# Patient Record
Sex: Male | Born: 1965
Health system: Southern US, Community
[De-identification: ages and names within clinical notes are randomized; demographics above are authoritative.]

## PROBLEM LIST (undated history)

## (undated) DIAGNOSIS — S3992XA Unspecified injury of lower back, initial encounter: Secondary | ICD-10-CM

## (undated) HISTORY — PX: COLONOSCOPY: SHX174

## (undated) HISTORY — DX: Unspecified injury of lower back, initial encounter: S39.92XA

---

## 2000-09-21 ENCOUNTER — Emergency Department (HOSPITAL_COMMUNITY): Admission: EM | Admit: 2000-09-21 | Discharge: 2000-09-21 | Payer: Self-pay | Admitting: Emergency Medicine

## 2005-07-20 ENCOUNTER — Ambulatory Visit: Payer: Self-pay | Admitting: Internal Medicine

## 2005-12-27 ENCOUNTER — Ambulatory Visit: Payer: Self-pay | Admitting: Internal Medicine

## 2007-05-29 ENCOUNTER — Ambulatory Visit: Payer: Self-pay | Admitting: Internal Medicine

## 2009-02-10 ENCOUNTER — Ambulatory Visit: Payer: Self-pay | Admitting: Internal Medicine

## 2009-02-17 LAB — CONVERTED CEMR LAB
ALT: 22 units/L (ref 0–53)
AST: 24 units/L (ref 0–37)
BUN: 16 mg/dL (ref 6–23)
Basophils Absolute: 0 10*3/uL (ref 0.0–0.1)
Basophils Relative: 0.1 % (ref 0.0–3.0)
CO2: 27 meq/L (ref 19–32)
Calcium: 8.9 mg/dL (ref 8.4–10.5)
Chloride: 109 meq/L (ref 96–112)
Cholesterol: 182 mg/dL (ref 0–200)
Creatinine, Ser: 1.1 mg/dL (ref 0.4–1.5)
Eosinophils Absolute: 0.1 10*3/uL (ref 0.0–0.7)
Eosinophils Relative: 1 % (ref 0.0–5.0)
GFR calc non Af Amer: 94.44 mL/min (ref >60)
Glucose, Bld: 89 mg/dL (ref 70–99)
HCT: 46.7 % (ref 39.0–52.0)
HDL: 32.5 mg/dL — ABNORMAL LOW (ref >39.00)
Hemoglobin: 15.9 g/dL (ref 13.0–17.0)
LDL Cholesterol: 131 mg/dL — ABNORMAL HIGH (ref 0–99)
Lymphocytes Relative: 41.1 % (ref 12.0–46.0)
Lymphs Abs: 2.8 10*3/uL (ref 0.7–4.0)
MCHC: 34.1 g/dL (ref 30.0–36.0)
MCV: 88.2 fL (ref 78.0–100.0)
Monocytes Absolute: 0.6 10*3/uL (ref 0.1–1.0)
Monocytes Relative: 8.5 % (ref 3.0–12.0)
Neutro Abs: 3.2 10*3/uL (ref 1.4–7.7)
Neutrophils Relative %: 49.3 % (ref 43.0–77.0)
Platelets: 219 10*3/uL (ref 150.0–400.0)
Potassium: 3.9 meq/L (ref 3.5–5.1)
RBC: 5.29 M/uL (ref 4.22–5.81)
RDW: 13.5 % (ref 11.5–14.6)
Sodium: 141 meq/L (ref 135–145)
TSH: 1.5 microintl units/mL (ref 0.35–5.50)
Total CHOL/HDL Ratio: 6
Triglycerides: 93 mg/dL (ref 0.0–149.0)
VLDL: 18.6 mg/dL (ref 0.0–40.0)
WBC: 6.7 10*3/uL (ref 4.5–10.5)

## 2010-04-28 ENCOUNTER — Ambulatory Visit: Payer: Self-pay | Admitting: Internal Medicine

## 2010-05-04 LAB — CONVERTED CEMR LAB
ALT: 22 units/L (ref 0–53)
AST: 20 units/L (ref 0–37)
BUN: 13 mg/dL (ref 6–23)
Basophils Absolute: 0 10*3/uL (ref 0.0–0.1)
Basophils Relative: 0.4 % (ref 0.0–3.0)
CO2: 28 meq/L (ref 19–32)
Calcium: 9.4 mg/dL (ref 8.4–10.5)
Chloride: 106 meq/L (ref 96–112)
Cholesterol: 182 mg/dL (ref 0–200)
Creatinine, Ser: 1.1 mg/dL (ref 0.4–1.5)
Eosinophils Absolute: 0.1 10*3/uL (ref 0.0–0.7)
Eosinophils Relative: 0.8 % (ref 0.0–5.0)
GFR calc non Af Amer: 90.1 mL/min (ref >60)
Glucose, Bld: 85 mg/dL (ref 70–99)
HCT: 46.1 % (ref 39.0–52.0)
HDL: 31.8 mg/dL — ABNORMAL LOW (ref >39.00)
Hemoglobin: 15.6 g/dL (ref 13.0–17.0)
LDL Cholesterol: 125 mg/dL — ABNORMAL HIGH (ref 0–99)
Lymphocytes Relative: 36.8 % (ref 12.0–46.0)
Lymphs Abs: 2.5 10*3/uL (ref 0.7–4.0)
MCHC: 33.8 g/dL (ref 30.0–36.0)
MCV: 87.6 fL (ref 78.0–100.0)
Monocytes Absolute: 0.5 10*3/uL (ref 0.1–1.0)
Monocytes Relative: 6.8 % (ref 3.0–12.0)
Neutro Abs: 3.8 10*3/uL (ref 1.4–7.7)
Neutrophils Relative %: 55.2 % (ref 43.0–77.0)
PSA: 0.99 ng/mL (ref 0.10–4.00)
Platelets: 259 10*3/uL (ref 150.0–400.0)
Potassium: 4.6 meq/L (ref 3.5–5.1)
RBC: 5.26 M/uL (ref 4.22–5.81)
RDW: 15.1 % — ABNORMAL HIGH (ref 11.5–14.6)
Sodium: 142 meq/L (ref 135–145)
TSH: 1.67 microintl units/mL (ref 0.35–5.50)
Total CHOL/HDL Ratio: 6
Triglycerides: 125 mg/dL (ref 0.0–149.0)
VLDL: 25 mg/dL (ref 0.0–40.0)
WBC: 6.8 10*3/uL (ref 4.5–10.5)

## 2010-10-15 LAB — CONVERTED CEMR LAB
ALT: 18 units/L (ref 0–53)
AST: 19 units/L (ref 0–37)
BUN: 9 mg/dL (ref 6–23)
Basophils Absolute: 0 10*3/uL (ref 0.0–0.1)
Basophils Relative: 0.1 % (ref 0.0–1.0)
CO2: 30 meq/L (ref 19–32)
Calcium: 9.5 mg/dL (ref 8.4–10.5)
Chloride: 106 meq/L (ref 96–112)
Cholesterol: 183 mg/dL (ref 0–200)
Creatinine, Ser: 1 mg/dL (ref 0.4–1.5)
Direct LDL: 113.8 mg/dL
Eosinophils Absolute: 0.1 10*3/uL (ref 0.0–0.6)
Eosinophils Relative: 1.4 % (ref 0.0–5.0)
GFR calc Af Amer: 106 mL/min
GFR calc non Af Amer: 88 mL/min
Glucose, Bld: 74 mg/dL (ref 70–99)
HCT: 45 % (ref 39.0–52.0)
HDL: 26.5 mg/dL — ABNORMAL LOW (ref >39.0)
Hemoglobin: 15.1 g/dL (ref 13.0–17.0)
Lymphocytes Relative: 35.3 % (ref 12.0–46.0)
MCHC: 33.6 g/dL (ref 30.0–36.0)
MCV: 86.9 fL (ref 78.0–100.0)
Monocytes Absolute: 0.5 10*3/uL (ref 0.2–0.7)
Monocytes Relative: 7.9 % (ref 3.0–11.0)
Neutro Abs: 3.4 10*3/uL (ref 1.4–7.7)
Neutrophils Relative %: 55.3 % (ref 43.0–77.0)
PSA: 0.64 ng/mL (ref 0.10–4.00)
Platelets: 235 10*3/uL (ref 150–400)
Potassium: 4.1 meq/L (ref 3.5–5.1)
RBC: 5.18 M/uL (ref 4.22–5.81)
RDW: 13.9 % (ref 11.5–14.6)
Sodium: 139 meq/L (ref 135–145)
TSH: 1.21 microintl units/mL (ref 0.35–5.50)
Total CHOL/HDL Ratio: 6.9
Triglycerides: 206 mg/dL (ref 0–149)
VLDL: 41 mg/dL — ABNORMAL HIGH (ref 0–40)
WBC: 6.2 10*3/uL (ref 4.5–10.5)

## 2010-10-17 NOTE — Assessment & Plan Note (Signed)
Summary: cpx//kn   Vital Signs:  Patient profile:   45 year old male Height:      74 inches Weight:      197 pounds BMI:     25.38 Pulse rate:   83 / minute Pulse rhythm:   regular BP sitting:   112 / 80  (left arm) Cuff size:   regular  Vitals Entered By: Army Fossa CMA (April 28, 2010 1:22 PM) CC: CPX: Fasting    History of Present Illness: CPX no concerns   Current Medications (verified): 1)  None  Allergies (verified): No Known Drug Allergies  Past History:  Past Medical History: Reviewed history from 02/10/2009 and no changes required. Unremarkable  Past Surgical History: Reviewed history from 02/10/2009 and no changes required. Denies surgical history  Family History: Reviewed history from 02/10/2009 and no changes required. 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 CV:  Denies chest pain or discomfort and swelling of feet. Resp:  Denies cough and wheezing. GI:  Denies bloody stools, diarrhea, nausea, and vomiting. GU:  Denies dysuria, hematuria, urinary frequency, and urinary hesitancy. Psych:  Denies anxiety and depression.  Physical Exam  General:  alert, well-developed, and well-nourished.   Neck:  no masses, no thyromegaly, and normal carotid upstroke.   Lungs:  normal respiratory effort, no intercostal retractions, no accessory muscle use, and normal breath sounds.   Heart:  normal rate, regular rhythm, and no murmur.   Abdomen:  soft, non-tender, no distention, no masses, no guarding, and no rigidity.   Rectal:  No external abnormalities noted. Normal sphincter tone. No rectal masses or  tenderness. Brown stools, Hemoccult negative Prostate:  Prostate gland firm and smooth, no enlargement, nodularity, tenderness, mass, asymmetry or induration. Extremities:  no lower extremity edema Psych:  Cognition and judgment appear intact. Alert and cooperative with normal attention span and concentration.  not anxious appearing and not depressed appearing.     Impression & Recommendations:  Problem # 1:  HEALTH SCREENING (ICD-V70.0) Td 2010 doing great encouraged more exercise printed material provided regards diet  Will start doing DRE, PSA and Hemoccults every 2 years since the patient is a minority (AA)  Orders: Venipuncture (64403) TLB-BMP (Basic Metabolic Panel-BMET) (80048-METABOL) TLB-CBC Platelet - w/Differential (85025-CBCD) TLB-ALT (SGPT) (84460-ALT) TLB-AST (SGOT) (84450-SGOT) TLB-Lipid Panel (80061-LIPID) TLB-TSH (Thyroid Stimulating Hormone) (84443-TSH) TLB-PSA (Prostate Specific Antigen) (84153-PSA) Specimen Handling (47425)  Patient Instructions: 1)  Please schedule a follow-up appointment in 1 year.

## 2011-09-10 ENCOUNTER — Encounter: Payer: Self-pay | Admitting: Internal Medicine

## 2011-09-13 ENCOUNTER — Ambulatory Visit (INDEPENDENT_AMBULATORY_CARE_PROVIDER_SITE_OTHER): Payer: BC Managed Care – PPO | Admitting: Internal Medicine

## 2011-09-13 ENCOUNTER — Encounter: Payer: Self-pay | Admitting: Internal Medicine

## 2011-09-13 DIAGNOSIS — Z0000 Encounter for general adult medical examination without abnormal findings: Secondary | ICD-10-CM

## 2011-09-13 HISTORY — DX: Encounter for general adult medical examination without abnormal findings: Z00.00

## 2011-09-13 LAB — COMPREHENSIVE METABOLIC PANEL
ALT: 22 U/L (ref 0–53)
AST: 25 U/L (ref 0–37)
Albumin: 4 g/dL (ref 3.5–5.2)
Alkaline Phosphatase: 65 U/L (ref 39–117)
BUN: 18 mg/dL (ref 6–23)
CO2: 28 mEq/L (ref 19–32)
Calcium: 9.1 mg/dL (ref 8.4–10.5)
Chloride: 108 mEq/L (ref 96–112)
Creatinine, Ser: 1.3 mg/dL (ref 0.4–1.5)
GFR: 79.76 mL/min (ref >60.00)
Glucose, Bld: 96 mg/dL (ref 70–99)
Potassium: 4.8 mEq/L (ref 3.5–5.1)
Sodium: 142 mEq/L (ref 135–145)
Total Bilirubin: 0.6 mg/dL (ref 0.3–1.2)
Total Protein: 6.8 g/dL (ref 6.0–8.3)

## 2011-09-13 LAB — CBC WITH DIFFERENTIAL/PLATELET
Basophils Absolute: 0 10*3/uL (ref 0.0–0.1)
Basophils Relative: 0.4 % (ref 0.0–3.0)
Eosinophils Absolute: 0.1 10*3/uL (ref 0.0–0.7)
Eosinophils Relative: 0.7 % (ref 0.0–5.0)
HCT: 44.3 % (ref 39.0–52.0)
Hemoglobin: 15 g/dL (ref 13.0–17.0)
Lymphocytes Relative: 32.5 % (ref 12.0–46.0)
Lymphs Abs: 2.6 10*3/uL (ref 0.7–4.0)
MCHC: 34 g/dL (ref 30.0–36.0)
MCV: 87.7 fl (ref 78.0–100.0)
Monocytes Absolute: 0.6 10*3/uL (ref 0.1–1.0)
Monocytes Relative: 7.5 % (ref 3.0–12.0)
Neutro Abs: 4.7 10*3/uL (ref 1.4–7.7)
Neutrophils Relative %: 58.9 % (ref 43.0–77.0)
Platelets: 234 10*3/uL (ref 150.0–400.0)
RBC: 5.05 Mil/uL (ref 4.22–5.81)
RDW: 14.8 % — ABNORMAL HIGH (ref 11.5–14.6)
WBC: 7.9 10*3/uL (ref 4.5–10.5)

## 2011-09-13 LAB — LIPID PANEL
Cholesterol: 196 mg/dL (ref 0–200)
HDL: 32.2 mg/dL — ABNORMAL LOW (ref >39.00)
Total CHOL/HDL Ratio: 6
Triglycerides: 220 mg/dL — ABNORMAL HIGH (ref 0.0–149.0)
VLDL: 44 mg/dL — ABNORMAL HIGH (ref 0.0–40.0)

## 2011-09-13 LAB — PSA: PSA: 0.85 ng/mL (ref 0.10–4.00)

## 2011-09-13 LAB — LDL CHOLESTEROL, DIRECT: Direct LDL: 121.2 mg/dL

## 2011-09-13 NOTE — Progress Notes (Signed)
  Subjective:    Patient ID: Jonathan Cohen, male    DOB: 29-Jun-1966, 45 y.o.   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  Respiratory: Negative for cough and shortness of breath.   Cardiovascular: Negative for chest pain and leg swelling.  Gastrointestinal: Negative for abdominal pain and blood in stool.  Genitourinary: Negative for dysuria and hematuria.       Objective:   Physical Exam  Constitutional: He is oriented to person, place, and time. He appears well-developed and well-nourished. No distress.  HENT:  Head: Normocephalic and atraumatic.  Neck: No thyromegaly present.       Nl carotid pulse   Cardiovascular: Normal rate, regular rhythm and normal heart sounds.   No murmur heard. Pulmonary/Chest: Effort normal and breath sounds normal. No respiratory distress. He has no wheezes. He has no rales.  Abdominal: Soft. Bowel sounds are normal. He exhibits no distension. There is no tenderness. There is no rebound and no guarding.  Genitourinary: Rectum normal and prostate normal. Guaiac negative stool.  Musculoskeletal: He exhibits no edema.  Neurological: He is alert and oriented to person, place, and time.  Skin: Skin is warm and dry. He is not diaphoretic.  Psychiatric: He has a normal mood and affect. His behavior is normal. Judgment and thought content normal.       Assessment & Plan:

## 2011-09-13 NOTE — Assessment & Plan Note (Addendum)
Td 2010 Declined flu shot doing great, encouraged more exercise and a healthier diet STE discussed  EKG nsr Labs (fasting x 8 hours) Will start doing DRE, PSA and Hemoccults every 2 years at age 45 since the patient is a minority (AA) Addendum--requested a "prostate check"--->DRE normal, PSA ordered

## 2011-09-14 ENCOUNTER — Encounter: Payer: Self-pay | Admitting: Internal Medicine

## 2011-09-14 LAB — TSH: TSH: 1.85 u[IU]/mL (ref 0.35–5.50)

## 2011-09-17 ENCOUNTER — Encounter: Payer: Self-pay | Admitting: Internal Medicine

## 2012-09-15 ENCOUNTER — Encounter: Payer: Self-pay | Admitting: Internal Medicine

## 2012-09-15 ENCOUNTER — Ambulatory Visit (INDEPENDENT_AMBULATORY_CARE_PROVIDER_SITE_OTHER): Payer: 59 | Admitting: Internal Medicine

## 2012-09-15 VITALS — BP 118/82 | HR 67 | Temp 97.6°F | Ht 75.0 in | Wt 199.0 lb

## 2012-09-15 DIAGNOSIS — Z0000 Encounter for general adult medical examination without abnormal findings: Secondary | ICD-10-CM

## 2012-09-15 LAB — LIPID PANEL: Total CHOL/HDL Ratio: 5

## 2012-09-15 NOTE — Patient Instructions (Signed)
Motrin 200 mg 2 tablets every 6 hours as needed for pain. Always take it with food. Watch for stomach side effects (gastritis): nausea, stomach pain, change in the color of stools. Call if the shoulder pain gets worse or no better in 2-3 weeks

## 2012-09-15 NOTE — Assessment & Plan Note (Addendum)
Td 2010 Declined flu shot Last triglycerides slightly elevated, discussed diet-exercise  Labs  Pt  requested a "prostate check"--->DRE normal, PSA ordered. We can check every 2 years if PSA normal. Shoulder pain, rotator cuff strain? Will try Motrin, will call for referral if not improving

## 2012-09-15 NOTE — Progress Notes (Signed)
  Subjective:    Patient ID: Jonathan Cohen, male    DOB: 07-14-1966, 46 y.o.   MRN: [id number]  HPI Complete physical exam  No past medical history on file.  Past Surgical History  Procedure Date  . No past surgeries    Family History  Problem Relation Age of Onset  . Hyperlipidemia Mother   . Heart disease Father 72    MI  . Diabetes Neg Hx   . Cancer Neg Hx    History   Social History  . Marital Status: Married    Spouse Name: N/A    Number of Children: 3  . 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 Smoking status: Never Smoker   . Smokeless tobacco: Never Used  . Alcohol Use: No  . Drug Use: No  . Sexually Active: Not on file   Other Topics Concern  . Not on file   Social History Narrative   Diet: healthy most of the time ---Exercise: more active at work recently, no regular exercise--Cowboys fan!    Review of Systems No chest pain or shortness of breath Denies nausea, vomiting, diarrhea or blood in the stools No dysuria or gross hematuria One-month history of mild right shoulder pain, has taken motrin sporadically, sx started at work although there was no actual accident,pain is noticeable whenever he raises his right arm above the shoulder.    Objective:   Physical Exam General -- alert, well-developed, and well-nourished.   Neck --no thyromegaly  Lungs -- normal respiratory effort, no intercostal retractions, no accessory muscle use, and normal breath sounds.   Heart-- normal rate, regular rhythm, no murmur, and no gallop.   Abdomen--soft, non-tender, no distention, no masses, no HSM, no guarding, and no rigidity.   Extremities-- no pretibial edema bilaterally; shoulder is without deformity, range of motion is normal bilaterally, nontender to palpation. Strength normal throughout Rectal-- No external abnormalities noted. Normal sphincter tone. No rectal masses or tenderness. Brown  stool, Hemoccult negative Prostate:  Prostate gland firm and smooth, no enlargement, nodularity, tenderness, mass, asymmetry or induration. Neurologic-- alert & oriented X3 and strength normal in all extremities. Psych-- Cognition and judgment appear intact. Alert and cooperative with normal attention span and concentration.  not anxious appearing and not depressed appearing.       Assessment & Plan:

## 2012-09-18 ENCOUNTER — Encounter: Payer: Self-pay | Admitting: *Deleted

## 2012-09-19 ENCOUNTER — Encounter: Payer: Self-pay | Admitting: *Deleted

## 2013-02-27 ENCOUNTER — Encounter: Payer: Self-pay | Admitting: Family Medicine

## 2013-02-27 ENCOUNTER — Ambulatory Visit (INDEPENDENT_AMBULATORY_CARE_PROVIDER_SITE_OTHER): Payer: 59 | Admitting: Family Medicine

## 2013-02-27 VITALS — BP 130/92 | HR 83 | Temp 98.1°F | Wt 197.8 lb

## 2013-02-27 DIAGNOSIS — M549 Dorsalgia, unspecified: Secondary | ICD-10-CM

## 2013-02-27 MED ORDER — CYCLOBENZAPRINE HCL 10 MG PO TABS: 10.0000 mg | ORAL_TABLET | Freq: Three times a day (TID) | ORAL | Status: DC | PRN

## 2013-02-27 MED ORDER — MELOXICAM 15 MG PO TABS: ORAL_TABLET | ORAL | Status: DC

## 2013-02-27 NOTE — Patient Instructions (Signed)
Back Pain, Adult  Low back pain is very common. About 1 in 5 people have back pain. The cause of low back pain is rarely dangerous. The pain often gets better over time. About half of people with a sudden onset of back pain feel better in just 2 weeks. About 8 in 10 people feel better by 6 weeks.   CAUSES  Some common causes of back pain include:  · Strain of the muscles or ligaments supporting the spine.  · Wear and tear (degeneration) of the spinal discs.  · Arthritis.  · Direct injury to the back.  DIAGNOSIS  Most of the time, the direct cause of low back pain is not known. However, back pain can be treated effectively even when the exact cause of the pain is unknown. Answering your caregiver's questions about your overall health and symptoms is one of the most accurate ways to make sure the cause of your pain is not dangerous. If your caregiver needs more information, he or she may order lab work or imaging tests (X-rays or MRIs). However, even if imaging tests show changes in your back, this usually does not require surgery.  HOME CARE INSTRUCTIONS  For many people, back pain returns. Since low back pain is rarely dangerous, it is often a condition that people can learn to manage on their own.   · Remain active. It is stressful on the back to sit or stand in one place. Do not sit, drive, or stand in one place for more than 30 minutes at a time. Take short walks on level surfaces as soon as pain allows. Try to increase the length of time you walk each day.  · Do not stay in bed. Resting more than 1 or 2 days can delay your recovery.  · Do not avoid exercise or work. Your body is made to move. It is not dangerous to be active, even though your back may hurt. Your back will likely heal faster if you return to being active before your pain is gone.  · Pay attention to your body when you  bend and lift. Many people have less discomfort when lifting if they bend their knees, keep the load close to their bodies, and  avoid twisting. Often, the most comfortable positions are those that put less stress on your recovering back.  · Find a comfortable position to sleep. Use a firm mattress and lie on your side with your knees slightly bent. If you lie on your back, put a pillow under your knees.  · Only take over-the-counter or prescription medicines as directed by your caregiver. Over-the-counter medicines to reduce pain and inflammation are often the most helpful. Your caregiver may prescribe muscle relaxant drugs. These medicines help dull your pain so you can more quickly return to your normal activities and healthy exercise.  · Put ice on the injured area.  · Put ice in a plastic bag.  · Place a towel between your skin and the bag.  · Leave the ice on for 15-20 minutes, 3-4 times a day for the first 2 to 3 days. After that, ice and heat may be alternated to reduce pain and spasms.  · Ask your caregiver about trying back exercises and gentle massage. This may be of some benefit.  · Avoid feeling anxious or stressed. Stress increases muscle tension and can worsen back pain. It is important to recognize when you are anxious or stressed and learn ways to manage it. Exercise is a great option.  SEEK MEDICAL CARE IF:  · You have pain that is not relieved with rest or   medicine.  · You have pain that does not improve in 1 week.  · You have new symptoms.  · You are generally not feeling well.  SEEK IMMEDIATE MEDICAL CARE IF:   · You have pain that radiates from your back into your legs.  · You develop new bowel or bladder control problems.  · You have unusual weakness or numbness in your arms or legs.  · You develop nausea or vomiting.  · You develop abdominal pain.  · You feel faint.  Document Released: 09/03/2005 Document Revised: 03/04/2012 Document Reviewed: 01/22/2011  ExitCare® Patient Information ©2014 [employer].

## 2013-02-27 NOTE — Progress Notes (Signed)
  Subjective:    Jonathan Cohen is a 47 y.o. male who presents for evaluation of low back pain. The patient has had no prior back problems. Symptoms have been present for 3 days and are unchanged.  Onset was related to / precipitated by lifting a heavy object. The pain is located in the right lumbar area and does not radiate. The pain is described as aching and occurs intermittently. He rates his pain as mild. Symptoms are exacerbated by flexion, lifting, sitting, standing and twisting. Symptoms are improved by rest. He has also tried nothing which provided no symptom relief. He has no other symptoms associated with the back pain. The patient has no "red flag" history indicative of complicated back pain.  The following portions of the patient's history were reviewed and updated as appropriate: allergies, current medications, past family history, past medical history, past social history, past surgical history and problem list.  Review of Systems Pertinent items are noted in HPI.    Objective:   Normal reflexes, gait, strength and negative straight-leg raise. Inspection and palpation: inspection of back is normal. Muscle tone and ROM exam: muscle spasm noted R lumbar paraspinal muscles.    Assessment:    Nonspecific acute low back pain    Plan:    Natural history and expected course discussed. Questions answered. Agricultural engineer distributed. Proper lifting, bending technique discussed. Short (2-4 day) period of relative rest recommended until acute symptoms improve. Ice to affected area as needed for local pain relief. Heat to affected area as needed for local pain relief. NSAIDs per medication orders. Muscle relaxants per medication orders. Patient can return to regular work on Monday 03/02/2013.

## 2013-03-02 ENCOUNTER — Telehealth: Payer: Self-pay | Admitting: Internal Medicine

## 2013-03-02 DIAGNOSIS — M549 Dorsalgia, unspecified: Secondary | ICD-10-CM

## 2013-03-02 NOTE — Telephone Encounter (Signed)
Please advise 

## 2013-03-02 NOTE — Telephone Encounter (Signed)
Patient Information:  Caller Name: Jonathan Cohen  Phone: [phone]  Patient: Jonathan Cohen, Jonathan Cohen  Gender: Male  DOB: 1966/01/10  Age: 47 Years  PCP: Willow Ora  Office Follow Up:  Does the office need to follow up with this patient?: Yes  Instructions For The Office: Requesting stronger pain medication and further testing to determine cause of back pain   Symptoms  Reason For Call & Symptoms: Wife/Jonathan Cohen states Ashton was seen in office on 02/27/13 due to lower back pain. Was ordered Flexeril, Mobic. Using ice and heat, Aspercreme, Icy hot pads. Has had little relief with medications. Rates back pain a 4 to 5  on 1/10 pt scale when sitting. Pain is worse with movement. Rates pain with movement a 6 on 1/10 pt scale. Pain radiating in to leg. Per back pain protocol has see today or tomorrow disposition due to pain radiating into thigh. Is scheduled to go to work at 1900 today 03/02/13. Lifting is required in his job. Requesting something stronger for [facility] [phone]. Requesting futher examination or testing to diagnose cause of back pain. Jonathan Cohen can be reached at [phone].  Reviewed Health History In EMR: Yes  Reviewed Medications In EMR: Yes  Reviewed Allergies In EMR: Yes  Reviewed Surgeries / Procedures: Yes  Date of Onset of Symptoms: 02/26/2013  Treatments Tried: Alternating ice and heat, Aspercreme, Icy hot pads, Flexeril and Mobic  Treatments Tried Worked: No  Guideline(s) Used:  Back Pain  Disposition Per Guideline:   See Today or Tomorrow in Office  Reason For Disposition Reached:   Pain radiates into the thigh or further down the leg  Advice Given:  Reassurance:  Twisting or heavy lifting can cause back pain.  With treatment, the pain most often goes away in 1-2 weeks.  You can treat most back pain at home.  Sleep:  Sleep on your side with a pillow between your knees. If you sleep on your back, put a pillow under your knees.  Avoid sleeping on your stomach.  Your mattress should be firm. Avoid waterbeds.  Call Back If:  Numbness or weakness occur  Bowel/bladder problems occur  You become worse.  RN Overrode Recommendation:  Patient Requests Prescription  Was seen in office on 02/27/13- has had no relief with Flexeril or Mobic.  Requesting stronger pain medication and testing to diagnose cause of back pain. Is willing to be seen in office.

## 2013-03-03 ENCOUNTER — Telehealth: Payer: Self-pay | Admitting: Internal Medicine

## 2013-03-03 MED ORDER — HYDROCODONE-ACETAMINOPHEN 5-300 MG PO TABS: 1.0000 | ORAL_TABLET | Freq: Four times a day (QID) | ORAL | Status: DC | PRN

## 2013-03-03 NOTE — Telephone Encounter (Signed)
Saw Dr Laury Axon, will send note to her

## 2013-03-03 NOTE — Telephone Encounter (Signed)
Caller: Rushi/Patient; Phone: [phone]; Reason for Call: Patient wanting to get update to PMD / Dr Drue Novel.  Was having back issues and was seen in office Thurs 6/12 Dr Laury Axon.  Tried the medication prescribed and it was not helping so went to [facility] in Clinic yesterday 6/16 and started Prednisone. "Just wanting to update chart"

## 2013-03-03 NOTE — Telephone Encounter (Signed)
vicodin 5-300 mg 1 po q6h prn #30  MRI LS spine  No contrast-----f/u with DR Drue Novel few days after MRI

## 2013-03-03 NOTE — Telephone Encounter (Signed)
Rx printed and a msg left to call to advised about the MRI      KP

## 2013-03-04 NOTE — Telephone Encounter (Signed)
Rx faxed and msg left to call the office    KP

## 2013-03-06 ENCOUNTER — Telehealth: Payer: Self-pay | Admitting: Internal Medicine

## 2013-03-06 NOTE — Telephone Encounter (Signed)
Patient cancelled the MRI--KP

## 2013-03-06 NOTE — Telephone Encounter (Signed)
In reference to MRI LS, patient was scheduled for 03/13/13.  Patient just called, told me to cancel the MRI, he doesn't think he needs an MRI.  States he started Prednisone, and would like to see how things go.  I informed patient to contact our office if her changes his mind.

## 2013-03-06 NOTE — Telephone Encounter (Signed)
Will send message to the doctor who saw him, thx

## 2013-03-09 NOTE — Telephone Encounter (Signed)
Let pt know, f/u w/ me prn

## 2013-03-09 NOTE — Telephone Encounter (Signed)
He should f/u with his pcp prn

## 2013-03-09 NOTE — Telephone Encounter (Signed)
Left msg for pt to return call.

## 2013-03-13 ENCOUNTER — Ambulatory Visit (HOSPITAL_COMMUNITY): Payer: 59

## 2013-07-21 ENCOUNTER — Encounter: Payer: Self-pay | Admitting: Internal Medicine

## 2013-09-18 ENCOUNTER — Encounter: Payer: 59 | Admitting: Internal Medicine

## 2013-09-23 ENCOUNTER — Telehealth: Payer: Self-pay

## 2013-09-23 NOTE — Telephone Encounter (Signed)
Medication List and allergies:  Reviewed and updated  90 day supply/mail order: na Local prescriptions: [facility]  Immunizations due: UTD  A/P:   No changes to FH or PSH or personal hx Tdap--2010 CCS--04/2013 PSA--08/2012--0.78  To Discuss with Provider: Nodule on back of neck

## 2013-09-24 ENCOUNTER — Encounter: Payer: Self-pay | Admitting: Internal Medicine

## 2013-09-24 ENCOUNTER — Ambulatory Visit (INDEPENDENT_AMBULATORY_CARE_PROVIDER_SITE_OTHER): Payer: 59 | Admitting: Internal Medicine

## 2013-09-24 VITALS — BP 124/65 | HR 72 | Temp 98.3°F | Wt 199.0 lb

## 2013-09-24 DIAGNOSIS — Z0000 Encounter for general adult medical examination without abnormal findings: Secondary | ICD-10-CM

## 2013-09-24 LAB — COMPREHENSIVE METABOLIC PANEL
ALBUMIN: 4.6 g/dL (ref 3.5–5.2)
ALK PHOS: 71 U/L (ref 39–117)
ALT: 32 U/L (ref 0–53)
AST: 25 U/L (ref 0–37)
BILIRUBIN TOTAL: 0.8 mg/dL (ref 0.3–1.2)
BUN: 15 mg/dL (ref 6–23)
CO2: 27 mEq/L (ref 19–32)
CREATININE: 1.2 mg/dL (ref 0.4–1.5)
Calcium: 9.5 mg/dL (ref 8.4–10.5)
Chloride: 103 mEq/L (ref 96–112)
GFR: 80.52 mL/min (ref >60.00)
GLUCOSE: 76 mg/dL (ref 70–99)
Potassium: 4.3 mEq/L (ref 3.5–5.1)
Sodium: 139 mEq/L (ref 135–145)
Total Protein: 7.8 g/dL (ref 6.0–8.3)

## 2013-09-24 LAB — CBC WITH DIFFERENTIAL/PLATELET
BASOS PCT: 0.3 % (ref 0.0–3.0)
Basophils Absolute: 0 10*3/uL (ref 0.0–0.1)
EOS PCT: 0.3 % (ref 0.0–5.0)
Eosinophils Absolute: 0 10*3/uL (ref 0.0–0.7)
HEMATOCRIT: 49 % (ref 39.0–52.0)
HEMOGLOBIN: 16.7 g/dL (ref 13.0–17.0)
Lymphocytes Relative: 42.2 % (ref 12.0–46.0)
Lymphs Abs: 3.4 10*3/uL (ref 0.7–4.0)
MCHC: 34.2 g/dL (ref 30.0–36.0)
MCV: 85.4 fl (ref 78.0–100.0)
MONO ABS: 0.6 10*3/uL (ref 0.1–1.0)
Monocytes Relative: 7.6 % (ref 3.0–12.0)
NEUTROS ABS: 4 10*3/uL (ref 1.4–7.7)
NEUTROS PCT: 49.6 % (ref 43.0–77.0)
Platelets: 280 10*3/uL (ref 150.0–400.0)
RBC: 5.73 Mil/uL (ref 4.22–5.81)
RDW: 14.8 % — ABNORMAL HIGH (ref 11.5–14.6)
WBC: 8.1 10*3/uL (ref 4.5–10.5)

## 2013-09-24 LAB — LIPID PANEL
CHOL/HDL RATIO: 7
Cholesterol: 240 mg/dL — ABNORMAL HIGH (ref 0–200)
HDL: 34.7 mg/dL — ABNORMAL LOW (ref >39.00)
TRIGLYCERIDES: 152 mg/dL — AB (ref 0.0–149.0)
VLDL: 30.4 mg/dL (ref 0.0–40.0)

## 2013-09-24 LAB — TSH: TSH: 1.61 u[IU]/mL (ref 0.35–5.50)

## 2013-09-24 LAB — LDL CHOLESTEROL, DIRECT: LDL DIRECT: 163.8 mg/dL

## 2013-09-24 NOTE — Progress Notes (Signed)
Pre visit review using our clinic review tool, if applicable. No additional management support is needed unless otherwise documented below in the visit note. 

## 2013-09-24 NOTE — Progress Notes (Signed)
   Subjective:    Patient ID: Jonathan Cohen, male    DOB: [birth year], 48 y.o.   MRN: [id number]  HPI CPX Noted a mass in the neck for months ago, no pain, no discomfort, has not increased in size since then  Past Medical History  Diagnosis Date  . Back injury    Past Surgical History  Procedure Laterality Date  . No past surgeries     History   Social History  . Marital Status: Married    Spouse Name: N/A    Number of Children: 3  . 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 Smoking status: Never Smoker   . Smokeless tobacco: Never Used  . Alcohol Use: No  . Drug Use: No  . Sexual Activity: Not on file   Other Topics Concern  . Not on file   Social History Narrative   C[employer] fan!   Family History  Problem Relation Age of Onset  . Hyperlipidemia Mother   . Heart disease Father 81    MI  . Diabetes Neg Hx   . Cancer Neg Hx     Review of Systems Diet-- trying to eat healthy  Exercise-- bike x 3/week No  CP, SOB, lower extremity edema Denies  nausea, vomiting diarrhea Denies  blood in the stools (-) cough, sputum production No dysuria, gross hematuria, difficulty urinating   No anxiety, depression      Objective:   Physical Exam  HENT:  Head:     BP 124/65  Pulse 72  Temp(Src) 98.3 F (36.8 C)  Wt 199 lb (90.266 kg)  SpO2 99% General -- alert, well-developed, NAD.  Neck --no thyromegaly , normal carotid pulse Lungs -- normal respiratory effort, no intercostal retractions, no accessory muscle use, and normal breath sounds.  Heart-- normal rate, regular rhythm, no murmur.  Abdomen-- Not distended, good bowel sounds,soft, non-tender.  Extremities-- no pretibial edema bilaterally  Neurologic--  alert & oriented X3. Speech normal, gait normal, strength normal in all extremities.  Psych-- Cognition and judgment appear intact. Cooperative with normal attention span and  concentration. No anxious or depressed appearing.      Assessment & Plan:

## 2013-09-24 NOTE — Assessment & Plan Note (Addendum)
Td 2010 flu shot 04-2013 discussed diet-exercise  DRE normal 08-2012 , PSA was normal as well-----> recheck next year  Labs  Neck mass likely a  sebacous cyst, only way to be 100% sure  is by doing a excision but that doesn't seem to be needed at this time; rec observation, self check, call if change in size or infection.

## 2013-09-24 NOTE — Patient Instructions (Signed)
Get your blood work before you leave   Next visit is for a physical exam in 1 year, fasting Please make an appointment    

## 2013-09-25 ENCOUNTER — Encounter: Payer: Self-pay | Admitting: *Deleted

## 2014-09-27 ENCOUNTER — Encounter: Payer: Self-pay | Admitting: Internal Medicine

## 2014-09-27 ENCOUNTER — Ambulatory Visit (INDEPENDENT_AMBULATORY_CARE_PROVIDER_SITE_OTHER): Payer: 59 | Admitting: Internal Medicine

## 2014-09-27 VITALS — BP 131/88 | HR 79 | Temp 97.6°F | Ht 75.0 in | Wt 199.5 lb

## 2014-09-27 DIAGNOSIS — R221 Localized swelling, mass and lump, neck: Secondary | ICD-10-CM

## 2014-09-27 DIAGNOSIS — Z0000 Encounter for general adult medical examination without abnormal findings: Secondary | ICD-10-CM

## 2014-09-27 LAB — BASIC METABOLIC PANEL
BUN: 15 mg/dL (ref 6–23)
CHLORIDE: 109 meq/L (ref 96–112)
CO2: 24 mEq/L (ref 19–32)
CREATININE: 1.2 mg/dL (ref 0.4–1.5)
Calcium: 9.5 mg/dL (ref 8.4–10.5)
GFR: 81.69 mL/min (ref >60.00)
Glucose, Bld: 104 mg/dL — ABNORMAL HIGH (ref 70–99)
Potassium: 4.2 mEq/L (ref 3.5–5.1)
Sodium: 142 mEq/L (ref 135–145)

## 2014-09-27 LAB — LIPID PANEL
CHOLESTEROL: 217 mg/dL — AB (ref 0–200)
HDL: 32 mg/dL — ABNORMAL LOW (ref >39.00)
LDL CALC: 160 mg/dL — AB (ref 0–99)
NonHDL: 185
Total CHOL/HDL Ratio: 7
Triglycerides: 125 mg/dL (ref 0.0–149.0)
VLDL: 25 mg/dL (ref 0.0–40.0)

## 2014-09-27 LAB — ALT: ALT: 25 U/L (ref 0–53)

## 2014-09-27 LAB — AST: AST: 22 U/L (ref 0–37)

## 2014-09-27 NOTE — Progress Notes (Signed)
   Subjective:    Patient ID: Jonathan Cohen, male    DOB: 03-19-1966, 49 y.o.   MRN: [id number]  DOS:  09/27/2014 Type of visit - description : cpx Interval history: Patient's wife is still concerned about the cyst in the neck. Self-examination is unchanged, no pain.   ROS Denies chest pain or difficulty breathing No nausea, vomiting, diarrhea or blood in the stools. No abdominal pain No cough or sputum production No dysuria, gross hematuria difficulty urinating  Past Medical History  Diagnosis Date  . Back injury     Past Surgical History  Procedure Laterality Date  . No past surgeries      History   Social History  . Marital Status: Married    Spouse Name: N/A    Number of Children: 3  . 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 Smoking status: Never Smoker   . Smokeless tobacco: Never Used  . Alcohol Use: No  . Drug Use: No  . Sexual Activity: Not on file   Other Topics Concern  . Not on file   Social History Narrative   Cow[employer] fan!   Household-- pt, wife, 3 children     Family History  Problem Relation Age of Onset  . Hyperlipidemia Mother   . Heart disease Father 64    MI  . Diabetes Neg Hx   . Cancer Neg Hx        Medication List    Notice  As of 09/27/2014 11:59 PM   You have not been prescribed any medications.         Objective:   Physical Exam BP 131/88 mmHg  Pulse 79  Temp(Src) 97.6 F (36.4 C) (Oral)  Ht 6\' 3"  (1.905 m)  Wt 199 lb 8 oz (90.493 kg)  BMI 24.94 kg/m2  SpO2 100% General -- alert, well-developed, NAD.  Neck --no thyromegaly, mass in the posterior aspect of the neck unchanged from previous years, if anything slightly smaller, 33 cm in size, soft, nontender HEENT-- Not pale.   Lungs -- normal respiratory effort, no intercostal retractions, no accessory muscle use, and normal breath sounds.  Heart-- normal rate, regular rhythm, no murmur.    Abdomen-- Not distended, good bowel sounds,soft, non-tender. Rectal-- No external abnormalities noted. Normal sphincter tone. No rectal masses or tenderness. No stool brown   Prostate--Prostate gland firm and smooth, no enlargement, nodularity, tenderness, mass, asymmetry or induration. Extremities-- no pretibial edema bilaterally  Neurologic--  alert & oriented X3. Speech normal, gait appropriate for age, strength symmetric and appropriate for age.  Psych-- Cognition and judgment appear intact. Cooperative with normal attention span and concentration. No anxious or depressed appearing.        Assessment & Plan:

## 2014-09-27 NOTE — Assessment & Plan Note (Addendum)
Td 2010 Had a flu shot   discussed diet-exercise  DRE normal , check a PSA as patient is African-American  STE  Labs  Neck mass likely a  sebacous cyst, patient's wife is still concern about it, examination remains benign. Offered surgical referral ----> patient likes to proceed

## 2014-09-27 NOTE — Patient Instructions (Signed)
Get your blood work before you leave   Please come back to the office in 1 year  for a physical exam. Come back fasting     Testicular Self-Exam A self-examination of your testicles involves looking at and feeling your testicles for abnormal lumps or swelling. Several things can cause swelling, lumps, or pain in your testicles. Some of these causes are:  Injuries.  Inflammation.  Infection.  Accumulation of fluids around your testicle (hydrocele).  Twisted testicles (testicular torsion).  Testicular cancer. Self-examination of the testicles and groin areas may be advised if you are at risk for testicular cancer. Risks for testicular cancer include:  An undescended testicle (cryptorchidism).  A history of previous testicular cancer.  A family history of testicular cancer. The testicles are easiest to examine after warm baths or showers and are more difficult to examine when you are cold. This is because the muscles attached to the testicles retract and pull them up higher or into the abdomen. Follow these steps while you are standing:  Hold your penis away from your body.  Roll one testicle between your thumb and forefinger, feeling the entire testicle.  Roll the other testicle between your thumb and forefinger, feeling the entire testicle. Feel for lumps, swelling, or discomfort. A normal testicle is egg shaped and feels firm. It is smooth and not tender. The spermatic cord can be felt as a firm spaghetti-like cord at the back of your testicle. It is also important to examine the crease between the front of your leg and your abdomen. Feel for any bumps that are tender. These could be enlarged lymph nodes.  Document Released: 12/10/2000 Document Revised: 05/06/2013 Document Reviewed: 02/23/2013 [facility] Patient Information 2015 [place], [place]. This information is not intended to replace advice given to you by your health care provider. Make sure you discuss any questions you  have with your health care provider.

## 2014-09-27 NOTE — Progress Notes (Signed)
Pre visit review using our clinic review tool, if applicable. No additional management support is needed unless otherwise documented below in the visit note. 

## 2014-11-02 ENCOUNTER — Telehealth: Payer: Self-pay | Admitting: Internal Medicine

## 2014-11-02 DIAGNOSIS — Z0000 Encounter for general adult medical examination without abnormal findings: Secondary | ICD-10-CM

## 2014-11-02 NOTE — Telephone Encounter (Signed)
PSA was ordered last month, I don't see the results, please ask the lab to release the results or as the patient to come back for another blood draw.

## 2014-11-03 NOTE — Telephone Encounter (Signed)
Spoke with Jonathan Cohen at the lab and she stated that the PSA was not ran not sure why believe it was because it was drawn in the wrong tube.  I will call the pt and ask if he would come back and let us redraw it.//AB/CMA

## 2014-11-03 NOTE — Telephone Encounter (Signed)
PSA was collected last month, however, results have not been placed in chart. Is there any way we can get these results? Thanks.

## 2014-11-03 NOTE — Telephone Encounter (Signed)
Called and spoke with the pt and informed him that we will need to repeat the PSA because the last test was not ran.  Pt verbalized understanding and agreed.  Pt was scheduled a lab appt for Thurs. (11/04/14 @ 9:30am).  Order placed.//AB/CMA

## 2014-11-03 NOTE — Telephone Encounter (Signed)
FYI

## 2014-11-03 NOTE — Telephone Encounter (Signed)
thx

## 2014-11-04 ENCOUNTER — Other Ambulatory Visit (INDEPENDENT_AMBULATORY_CARE_PROVIDER_SITE_OTHER): Payer: 59

## 2014-11-04 DIAGNOSIS — Z0000 Encounter for general adult medical examination without abnormal findings: Secondary | ICD-10-CM

## 2014-11-04 LAB — PSA: PSA: 0.88 ng/mL (ref 0.10–4.00)

## 2015-09-28 ENCOUNTER — Encounter: Payer: 59 | Admitting: Internal Medicine

## 2015-10-28 ENCOUNTER — Telehealth: Payer: Self-pay | Admitting: *Deleted

## 2015-10-28 NOTE — Telephone Encounter (Signed)
Unable to reach patient at time of pre-visit call. Left message for patient to return call when available.  

## 2015-10-31 ENCOUNTER — Ambulatory Visit (INDEPENDENT_AMBULATORY_CARE_PROVIDER_SITE_OTHER): Payer: 59 | Admitting: Internal Medicine

## 2015-10-31 ENCOUNTER — Encounter: Payer: Self-pay | Admitting: Internal Medicine

## 2015-10-31 VITALS — BP 116/78 | HR 79 | Temp 97.8°F | Ht 75.0 in | Wt 200.5 lb

## 2015-10-31 DIAGNOSIS — Z0000 Encounter for general adult medical examination without abnormal findings: Secondary | ICD-10-CM | POA: Diagnosis not present

## 2015-10-31 DIAGNOSIS — Z09 Encounter for follow-up examination after completed treatment for conditions other than malignant neoplasm: Secondary | ICD-10-CM

## 2015-10-31 LAB — HIV ANTIBODY (ROUTINE TESTING W REFLEX): HIV: NONREACTIVE

## 2015-10-31 LAB — LIPID PANEL
CHOL/HDL RATIO: 6
CHOLESTEROL: 194 mg/dL (ref 0–200)
HDL: 31 mg/dL — ABNORMAL LOW (ref >39.00)
LDL Cholesterol: 130 mg/dL — ABNORMAL HIGH (ref 0–99)
NonHDL: 162.66
TRIGLYCERIDES: 162 mg/dL — AB (ref 0.0–149.0)
VLDL: 32.4 mg/dL (ref 0.0–40.0)

## 2015-10-31 LAB — BASIC METABOLIC PANEL
BUN: 13 mg/dL (ref 6–23)
CO2: 30 mEq/L (ref 19–32)
Calcium: 9.9 mg/dL (ref 8.4–10.5)
Chloride: 105 mEq/L (ref 96–112)
Creatinine, Ser: 1.18 mg/dL (ref 0.40–1.50)
GFR: 84.5 mL/min (ref >60.00)
Glucose, Bld: 92 mg/dL (ref 70–99)
POTASSIUM: 4.8 meq/L (ref 3.5–5.1)
SODIUM: 140 meq/L (ref 135–145)

## 2015-10-31 LAB — HEMOGLOBIN A1C: Hgb A1c MFr Bld: 5.9 % (ref 4.6–6.5)

## 2015-10-31 LAB — CBC WITH DIFFERENTIAL/PLATELET
BASOS ABS: 0 10*3/uL (ref 0.0–0.1)
Basophils Relative: 0.4 % (ref 0.0–3.0)
EOS ABS: 0.1 10*3/uL (ref 0.0–0.7)
Eosinophils Relative: 1.7 % (ref 0.0–5.0)
HCT: 47.9 % (ref 39.0–52.0)
Hemoglobin: 16.1 g/dL (ref 13.0–17.0)
LYMPHS ABS: 3.5 10*3/uL (ref 0.7–4.0)
Lymphocytes Relative: 40 % (ref 12.0–46.0)
MCHC: 33.7 g/dL (ref 30.0–36.0)
MCV: 86.4 fl (ref 78.0–100.0)
Monocytes Absolute: 0.6 10*3/uL (ref 0.1–1.0)
Monocytes Relative: 7 % (ref 3.0–12.0)
NEUTROS ABS: 4.4 10*3/uL (ref 1.4–7.7)
NEUTROS PCT: 50.9 % (ref 43.0–77.0)
PLATELETS: 314 10*3/uL (ref 150.0–400.0)
RBC: 5.54 Mil/uL (ref 4.22–5.81)
RDW: 14.7 % (ref 11.5–15.5)
WBC: 8.6 10*3/uL (ref 4.0–10.5)

## 2015-10-31 NOTE — Assessment & Plan Note (Addendum)
Td 2010 Had a flu shot   DRE   PSA  2016 wnl, reassess next year  Labs : BMP, A1c, FLP, CBC, HIV EKG: Sinus rhythm, unchanged from previous. Diet exercise: He is doing great.

## 2015-10-31 NOTE — Patient Instructions (Signed)
GO TO THE LAB : Get the blood work    GO TO THE FRONT DESK  Schedule a complete physical exam to be done in 1 year  Please be fasting

## 2015-10-31 NOTE — Progress Notes (Signed)
Pre visit review using our clinic review tool, if applicable. No additional management support is needed unless otherwise documented below in the visit note. 

## 2015-10-31 NOTE — Progress Notes (Signed)
Subjective:    Patient ID: Jonathan Cohen, male    DOB: 1966/02/06, 50 y.o.   MRN: [id number]  DOS:  10/31/2015 Type of visit - description : CPX  Interval history: No concerns, feels really well.   Review of Systems  Constitutional: No fever. No chills. No unexplained wt changes. No unusual sweats  HEENT: No dental problems, no ear discharge, no facial swelling, no voice changes. No eye discharge, no eye  redness , no  intolerance to light   Respiratory: No wheezing , no  difficulty breathing. No cough , no mucus production  Cardiovascular: No CP, no leg swelling , no  Palpitations  GI: no nausea, no vomiting, no diarrhea , no  abdominal pain.  No blood in the stools. No dysphagia, no odynophagia    Endocrine: No polyphagia, no polyuria , no polydipsia  GU: No dysuria, gross hematuria, difficulty urinating. No urinary urgency, no frequency.  Musculoskeletal: No joint swellings or unusual aches or pains  Skin: No change in the color of the skin, palor , no  Rash  Allergic, immunologic: No environmental allergies , no  food allergies  Neurological: No dizziness no  syncope. No headaches. No diplopia, no slurred, no slurred speech, no motor deficits, no facial  Numbness  Hematological: No enlarged lymph nodes, no easy bruising , no unusual bleedings  Psychiatry: No suicidal ideas, no hallucinations, no beavior problems, no confusion.  No unusual/severe anxiety, no depression  Past Medical History  Diagnosis Date  . Back injury     Past Surgical History  Procedure Laterality Date  . No past surgeries      Social History   Social History  . Marital Status: Married    Spouse Name: N/A  . Number of Children: 3  . 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 Smoking status: Never Smoker   . Smokeless tobacco: Never Used  . Alcohol Use: No  . Drug Use: No  . Sexual Activity: Not on file   Other  Topics Concern  . Not on file   Social History Narrative   Cowboys fan!   Household-- pt, wife, 3 children     Family History  Problem Relation Age of Onset  . Hyperlipidemia Mother   . Heart disease Father 77    MI  . Diabetes Neg Hx   . Cancer Neg Hx        Medication List    Notice  As of 10/31/2015  4:57 PM   You have not been prescribed any medications.         Objective:   Physical Exam BP 116/78 mmHg  Pulse 79  Temp(Src) 97.8 F (36.6 C) (Oral)  Ht 6\' 3"  (1.905 m)  Wt 200 lb 8 oz (90.946 kg)  BMI 25.06 kg/m2  SpO2 98% General:   Well developed, well nourished . NAD.  Neck:  Has had 33 cm soft nontender mass at the posterior neck consistent with a cyst HEENT:  Normocephalic . Face symmetric, atraumatic Lungs:  CTA B Normal respiratory effort, no intercostal retractions, no accessory muscle use. Heart: RRR,  no murmur.  No pretibial edema bilaterally  Abdomen:  Not distended, soft, non-tender. No rebound or rigidity.  Skin: Exposed areas without rash. Not pale. Not jaundice Neurologic:  alert & oriented X3.  Speech normal, gait appropriate for age and unassisted Strength symmetric and appropriate for age.  Psych: Cognition  and judgment appear intact.  Cooperative with normal attention span and concentration.  Behavior appropriate. No anxious or depressed appearing.    Assessment & Plan:   Assessment Sebaceous cyst, neck  Plan: Sebaceous cyst: Stable. Saw surgery last year, they Rx observation RTC 1 year RTC one year

## 2015-10-31 NOTE — Assessment & Plan Note (Signed)
Sebaceous cyst: Stable. Saw surgery last year, they Rx observation RTC 1 year

## 2015-12-23 ENCOUNTER — Encounter: Payer: 59 | Admitting: Internal Medicine

## 2016-10-31 ENCOUNTER — Ambulatory Visit (INDEPENDENT_AMBULATORY_CARE_PROVIDER_SITE_OTHER): Payer: 59 | Admitting: Internal Medicine

## 2016-10-31 ENCOUNTER — Encounter: Payer: Self-pay | Admitting: Internal Medicine

## 2016-10-31 VITALS — BP 124/78 | HR 76 | Temp 98.2°F | Resp 12 | Ht 75.0 in | Wt 201.4 lb

## 2016-10-31 DIAGNOSIS — Z0000 Encounter for general adult medical examination without abnormal findings: Secondary | ICD-10-CM | POA: Diagnosis not present

## 2016-10-31 DIAGNOSIS — IMO0002 Reserved for concepts with insufficient information to code with codable children: Secondary | ICD-10-CM

## 2016-10-31 LAB — BASIC METABOLIC PANEL
BUN: 16 mg/dL (ref 6–23)
CALCIUM: 9.6 mg/dL (ref 8.4–10.5)
CO2: 28 meq/L (ref 19–32)
CREATININE: 1.25 mg/dL (ref 0.40–1.50)
Chloride: 105 mEq/L (ref 96–112)
GFR: 78.74 mL/min (ref >60.00)
GLUCOSE: 94 mg/dL (ref 70–99)
Potassium: 4.4 mEq/L (ref 3.5–5.1)
Sodium: 137 mEq/L (ref 135–145)

## 2016-10-31 LAB — LIPID PANEL
CHOL/HDL RATIO: 6
Cholesterol: 205 mg/dL — ABNORMAL HIGH (ref 0–200)
HDL: 37.3 mg/dL — ABNORMAL LOW (ref >39.00)
LDL Cholesterol: 149 mg/dL — ABNORMAL HIGH (ref 0–99)
NONHDL: 167.85
TRIGLYCERIDES: 94 mg/dL (ref 0.0–149.0)
VLDL: 18.8 mg/dL (ref 0.0–40.0)

## 2016-10-31 LAB — HEMOGLOBIN A1C: HEMOGLOBIN A1C: 5.8 % (ref 4.6–6.5)

## 2016-10-31 NOTE — Assessment & Plan Note (Addendum)
Td 2010;  Had a flu shot   DRE   PSA  2016 wnl, reassess next year  Labs : BMP, FLP, A1c  Diet exercise: He is doing great.

## 2016-10-31 NOTE — Progress Notes (Signed)
Subjective:    Patient ID: Jonathan Cohen, male    DOB: Jun 24, 1966, 51 y.o.   MRN: [id number]  DOS:  10/31/2016 Type of visit - description : cpx Interval history: No major concerns   Review of Systems  Constitutional: No fever. No chills. No unexplained wt changes. No unusual sweats  HEENT:  Sebaceous cyst at the neck, seems slightly bigger in the last few months. No dental problems, no ear discharge, no facial swelling, no voice changes. No eye discharge, no eye  redness , no  intolerance to light   Respiratory: No wheezing , no  difficulty breathing. No cough , no mucus production  Cardiovascular: No CP, no leg swelling , no  Palpitations  GI: no nausea, no vomiting, no diarrhea , no  abdominal pain.  No blood in the stools. No dysphagia, no odynophagia    Endocrine: No polyphagia, no polyuria , no polydipsia  GU: No dysuria, gross hematuria, difficulty urinating. No urinary urgency, no frequency.  Musculoskeletal: No joint swellings or unusual aches or pains  Skin: No change in the color of the skin, palor , no  Rash  Allergic, immunologic: No environmental allergies , no  food allergies  Neurological: No dizziness no  syncope. No headaches. No diplopia, no slurred, no slurred speech, no motor deficits, no facial  Numbness  Hematological: No enlarged lymph nodes, no easy bruising , no unusual bleedings  Psychiatry: No suicidal ideas, no hallucinations, no beavior problems, no confusion.  No unusual/severe anxiety, no depression   Past Medical History:  Diagnosis Date  . Back injury     Past Surgical History:  Procedure Laterality Date  . NO PAST SURGERIES      Social History   Social History  . Marital status: Married    Spouse name: N/A  . Number of children: 3  . 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 Smoking status: Never Smoker  . Smokeless tobacco: Never Used  . Alcohol use  No  . Drug use: No  . Sexual activity: Not on file   Other Topics Concern  . Not on file   Social History Narrative   Cowboys fan!   Household-- pt, wife    3 children-- 1999;  2001; 2006      Family History  Problem Relation Age of Onset  . Heart disease Father 6    MI  . Hyperlipidemia Mother   . Diabetes Neg Hx   . Cancer Neg Hx       Allergies as of 10/31/2016   No Known Allergies     Medication List    as of 10/31/2016 11:59 PM   You have not been prescribed any medications.        Objective:   Physical Exam BP 124/78 (BP Location: Left Arm, Patient Position: Sitting, Cuff Size: Normal)   Pulse 76   Temp 98.2 F (36.8 C) (Oral)   Resp 12   Ht 6\' 3"  (1.905 m)   Wt 201 lb 6 oz (91.3 kg)   SpO2 98%   BMI 25.17 kg/m  General:   Well developed, well nourished . NAD.  Neck: No  thyromegaly . Has a cyst on the posterior leg, soft, mobile, nontender. See picture HEENT:  Normocephalic . Face symmetric, atraumatic Lungs:  CTA B Normal respiratory effort, no intercostal retractions, no accessory muscle use. Heart: RRR,  no murmur.  No pretibial  edema bilaterally  Abdomen:  Not distended, soft, non-tender. No rebound or rigidity.   Skin: Exposed areas without rash. Not pale. Not jaundice Neurologic:  alert & oriented X3.  Speech normal, gait appropriate for age and unassisted Strength symmetric and appropriate for age.  Psych: Cognition and judgment appear intact.  Cooperative with normal attention span and concentration.  Behavior appropriate. No anxious or depressed appearing.       Assessment & Plan:    Assessment Hyperglycemia: A1c 5.9 (10-2015) Sebaceous cyst, neck  Plan: Sebaceous cyst:  Well known sebaceous cyst on the neck, patient thinks is a little larger. He saw surgery 10-2014, it was 43 cm. He saw me last year, ~ 3x 3 cm. Today it measures 7x 5 cm, still has very benign features. Rec reassess  by surgery d/t increase size RTC  one year

## 2016-10-31 NOTE — Progress Notes (Signed)
Pre visit review using our clinic review tool, if applicable. No additional management support is needed unless otherwise documented below in the visit note. 

## 2016-10-31 NOTE — Patient Instructions (Signed)
GO TO THE LAB : Get the blood work     GO TO THE FRONT DESK Schedule your next appointment for a  Check up in 6 months   

## 2016-11-01 NOTE — Assessment & Plan Note (Signed)
Sebaceous cyst:  Well known sebaceous cyst on the neck, patient thinks is a little larger. He saw surgery 10-2014, it was 43 cm. He saw me last year, ~ 3x 3 cm. Today it measures 7x 5 cm, still has very benign features. Rec reassess  by surgery d/t increase size RTC one year

## 2016-11-05 DIAGNOSIS — R221 Localized swelling, mass and lump, neck: Secondary | ICD-10-CM | POA: Diagnosis not present

## 2016-11-15 HISTORY — PX: LIPOMA EXCISION: SHX5283

## 2016-12-03 DIAGNOSIS — D170 Benign lipomatous neoplasm of skin and subcutaneous tissue of head, face and neck: Secondary | ICD-10-CM | POA: Diagnosis not present

## 2016-12-31 ENCOUNTER — Telehealth: Payer: Self-pay | Admitting: Internal Medicine

## 2016-12-31 NOTE — Telephone Encounter (Signed)
Patient wants to give Dr. Larose Kells an update on his recent surgery, patient is aware he is currently out of the office and is OK waiting until his return.

## 2016-12-31 NOTE — Telephone Encounter (Signed)
FYI

## 2017-01-02 NOTE — Telephone Encounter (Addendum)
LMOM, I think he is talking about the removal of a neck mass, he saw surgery 2 months ago. Asked the patient to call back if needed.

## 2017-10-07 DIAGNOSIS — M25861 Other specified joint disorders, right knee: Secondary | ICD-10-CM | POA: Diagnosis not present

## 2017-11-04 ENCOUNTER — Encounter: Payer: 59 | Admitting: Internal Medicine

## 2017-11-06 ENCOUNTER — Encounter: Payer: Self-pay | Admitting: Internal Medicine

## 2017-11-06 ENCOUNTER — Ambulatory Visit (INDEPENDENT_AMBULATORY_CARE_PROVIDER_SITE_OTHER): Payer: 59 | Admitting: Internal Medicine

## 2017-11-06 VITALS — BP 116/66 | HR 80 | Temp 98.2°F | Resp 14 | Ht 75.0 in | Wt 205.0 lb

## 2017-11-06 DIAGNOSIS — Z1211 Encounter for screening for malignant neoplasm of colon: Secondary | ICD-10-CM

## 2017-11-06 DIAGNOSIS — R739 Hyperglycemia, unspecified: Secondary | ICD-10-CM | POA: Diagnosis not present

## 2017-11-06 DIAGNOSIS — Z0000 Encounter for general adult medical examination without abnormal findings: Secondary | ICD-10-CM

## 2017-11-06 NOTE — Progress Notes (Signed)
Subjective:    Patient ID: Jonathan Cohen, male    DOB: Nov 30, 1965, 52 y.o.   MRN: [id number]  DOS:  11/06/2017 Type of visit - description : CPX Interval history: Doing well, he does have a couple of concerns   Review of Systems  back in September 2018, he saw red blood during a bowel movement, he was drops in the toilet paper and on the stools.  No associated with rectal pain or itching. No further events, he has increased his fiber intake  Other than above, a 14 point review of systems is negative     Past Medical History:  Diagnosis Date  . Back injury     Past Surgical History:  Procedure Laterality Date  . LIPOMA EXCISION N/A 11/2016   from posterior  neck     Social History   Socioeconomic History  . Marital status: Married    Spouse name: Not on file  . Number of children: 3  . Years of education: Not on file  . Highest education level: Not on file  Social Needs  . Financial resource strain: Not on file  . Food insecurity - worry: Not on file  . Food insecurity - inability: Not on file  . Transportation needs - medical: Not on file  . Transportation needs - non-medical: Not on file  Occupational History  . Occupation: IT sales professional: [employer]  Tobacco Use  . Smoking status: Never Smoker  . Smokeless tobacco: Never Used  Substance and Sexual Activity  . Alcohol use: No  . Drug use: No  . Sexual activity: Not on file  Other Topics Concern  . Not on file  Social History Narrative   Cow[employer] fan!   Household-- pt, wife and 3 children   3 children-- 1999;  2001; 2006     Family History  Problem Relation Age of Onset  . Heart disease Father 49       MI  . Hyperlipidemia Mother   . Diabetes Neg Hx   . Cancer Neg Hx      Allergies as of 11/06/2017   No Known Allergies     Medication List    as of 11/06/2017 11:59 PM   You have not been prescribed any medications.        Objective:   Physical Exam BP 116/66 (BP Location:  Left Arm, Patient Position: Sitting, Cuff Size: Small)   Pulse 80   Temp 98.2 F (36.8 C) (Oral)   Resp 14   Ht 6\' 3"  (1.905 m)   Wt 205 lb (93 kg)   SpO2 99%   BMI 25.62 kg/m  General:   Well developed, well nourished . NAD.  Neck: No  thyromegaly  HEENT:  Normocephalic . Face symmetric, atraumatic Lungs:  CTA B Normal respiratory effort, no intercostal retractions, no accessory muscle use. Heart: RRR,  no murmur.  No pretibial edema bilaterally  Abdomen:  Not distended, soft, non-tender. No rebound or rigidity.  Rectal:  External abnormalities: none. Normal sphincter tone. No rectal masses or tenderness.  Brown stools. Anoscopy: Small internal hemorrhoids, mild mucosal irritation slightly distant from hemorrhoids?Marland Kitchen  No polyps seen. Prostate: Prostate gland firm and smooth, no enlargement, nodularity, tenderness, mass, asymmetry or induration.  Skin: Exposed areas without rash. Not pale. Not jaundice Neurologic:  alert & oriented X3.  Speech normal, gait appropriate for age and unassisted Strength symmetric and appropriate for age.  Psych: Cognition and judgment appear intact.  Cooperative  with normal attention span and concentration.  Behavior appropriate. No anxious or depressed appearing.     Assessment & Plan:  Assessment Hyperglycemia: A1c 5.9 (10-2015) Sebaceous cyst, neck  Plan: Sebaceous cyst: Status post excision last year. Hyperglycemia: Checking labs Has internal hemorrhoids, increase fiber intake.  Recent bleeding few months ago likely related to hemorrhoids.  He has been refer to GI for a colonoscopy RTC 1 year

## 2017-11-06 NOTE — Patient Instructions (Signed)
GO TO THE LAB : Get the blood work     GO TO THE FRONT DESK Schedule your next appointment for a  Physical in 1 year  

## 2017-11-06 NOTE — Progress Notes (Signed)
Pre visit review using our clinic review tool, if applicable. No additional management support is needed unless otherwise documented below in the visit note. 

## 2017-11-06 NOTE — Assessment & Plan Note (Signed)
-  Td 2010;  -CCS: 3 modalities discussed, given recent rectal bleed I recommend a colonoscopy, he agrees, refer to GI -prostate ca screening DRE wnl, check PSA    -Labs : CMP, FLP, CBC, A1c, TSH, PSA   -Diet exercise, remains active and eating healthy. Praised

## 2017-11-07 ENCOUNTER — Encounter: Payer: Self-pay | Admitting: Gastroenterology

## 2017-11-07 LAB — CBC WITH DIFFERENTIAL/PLATELET
Basophils Absolute: 0.1 10*3/uL (ref 0.0–0.1)
Basophils Relative: 0.8 % (ref 0.0–3.0)
EOS PCT: 1 % (ref 0.0–5.0)
Eosinophils Absolute: 0.1 10*3/uL (ref 0.0–0.7)
HCT: 47.9 % (ref 39.0–52.0)
Hemoglobin: 16.2 g/dL (ref 13.0–17.0)
LYMPHS ABS: 1.6 10*3/uL (ref 0.7–4.0)
Lymphocytes Relative: 23.3 % (ref 12.0–46.0)
MCHC: 33.8 g/dL (ref 30.0–36.0)
MCV: 86.8 fl (ref 78.0–100.0)
Monocytes Absolute: 0.7 10*3/uL (ref 0.1–1.0)
Monocytes Relative: 10.2 % (ref 3.0–12.0)
NEUTROS ABS: 4.4 10*3/uL (ref 1.4–7.7)
NEUTROS PCT: 64.7 % (ref 43.0–77.0)
PLATELETS: 260 10*3/uL (ref 150.0–400.0)
RBC: 5.52 Mil/uL (ref 4.22–5.81)
RDW: 14.9 % (ref 11.5–15.5)
WBC: 6.9 10*3/uL (ref 4.0–10.5)

## 2017-11-07 LAB — LIPID PANEL
Cholesterol: 178 mg/dL (ref 0–200)
HDL: 33.1 mg/dL — AB (ref >39.00)
LDL Cholesterol: 124 mg/dL — ABNORMAL HIGH (ref 0–99)
NONHDL: 145.36
Total CHOL/HDL Ratio: 5
Triglycerides: 107 mg/dL (ref 0.0–149.0)
VLDL: 21.4 mg/dL (ref 0.0–40.0)

## 2017-11-07 LAB — PSA: PSA: 1.3 ng/mL (ref 0.10–4.00)

## 2017-11-07 LAB — COMPREHENSIVE METABOLIC PANEL
ALK PHOS: 76 U/L (ref 39–117)
ALT: 23 U/L (ref 0–53)
AST: 20 U/L (ref 0–37)
Albumin: 4.3 g/dL (ref 3.5–5.2)
BILIRUBIN TOTAL: 0.9 mg/dL (ref 0.2–1.2)
BUN: 11 mg/dL (ref 6–23)
CALCIUM: 9.9 mg/dL (ref 8.4–10.5)
CO2: 33 mEq/L — ABNORMAL HIGH (ref 19–32)
CREATININE: 1.14 mg/dL (ref 0.40–1.50)
Chloride: 103 mEq/L (ref 96–112)
GFR: 87.21 mL/min (ref >60.00)
GLUCOSE: 95 mg/dL (ref 70–99)
Potassium: 5.1 mEq/L (ref 3.5–5.1)
Sodium: 140 mEq/L (ref 135–145)
TOTAL PROTEIN: 7.1 g/dL (ref 6.0–8.3)

## 2017-11-07 LAB — HEMOGLOBIN A1C: HEMOGLOBIN A1C: 5.8 % (ref 4.6–6.5)

## 2017-11-07 LAB — TSH: TSH: 1.47 u[IU]/mL (ref 0.35–4.50)

## 2017-11-07 NOTE — Assessment & Plan Note (Signed)
Sebaceous cyst: Status post excision last year. Hyperglycemia: Checking labs Has internal hemorrhoids, increase fiber intake.  Recent bleeding few months ago likely related to hemorrhoids.  He has been refer to GI for a colonoscopy RTC 1 year

## 2017-12-16 ENCOUNTER — Ambulatory Visit (AMBULATORY_SURGERY_CENTER): Payer: Self-pay | Admitting: *Deleted

## 2017-12-16 ENCOUNTER — Other Ambulatory Visit: Payer: Self-pay

## 2017-12-16 VITALS — Ht 75.0 in | Wt 203.0 lb

## 2017-12-16 DIAGNOSIS — Z1211 Encounter for screening for malignant neoplasm of colon: Secondary | ICD-10-CM

## 2017-12-16 DIAGNOSIS — Z800 Family history of malignant neoplasm of digestive organs: Secondary | ICD-10-CM

## 2017-12-16 MED ORDER — NA SULFATE-K SULFATE-MG SULF 17.5-3.13-1.6 GM/177ML PO SOLN: ORAL | 0 refills | Status: DC

## 2017-12-16 NOTE — Progress Notes (Signed)
Patient denies any allergies to eggs or soy. Patient denies any problems with anesthesia/sedation. Patient denies any oxygen use at home. Patient denies taking any diet/weight loss medications or blood thinners. EMMI education assisgned to patient on colonoscopy, this was explained and instructions given to patient. 

## 2017-12-17 ENCOUNTER — Encounter: Payer: Self-pay | Admitting: Gastroenterology

## 2017-12-30 ENCOUNTER — Ambulatory Visit (AMBULATORY_SURGERY_CENTER): Payer: 59 | Admitting: Gastroenterology

## 2017-12-30 ENCOUNTER — Other Ambulatory Visit: Payer: Self-pay

## 2017-12-30 ENCOUNTER — Encounter: Payer: Self-pay | Admitting: Gastroenterology

## 2017-12-30 VITALS — BP 114/70 | HR 80 | Temp 99.5°F | Resp 17 | Ht 75.0 in | Wt 203.0 lb

## 2017-12-30 DIAGNOSIS — Z1211 Encounter for screening for malignant neoplasm of colon: Secondary | ICD-10-CM | POA: Diagnosis present

## 2017-12-30 DIAGNOSIS — D125 Benign neoplasm of sigmoid colon: Secondary | ICD-10-CM | POA: Diagnosis not present

## 2017-12-30 DIAGNOSIS — Z800 Family history of malignant neoplasm of digestive organs: Secondary | ICD-10-CM | POA: Diagnosis not present

## 2017-12-30 DIAGNOSIS — D123 Benign neoplasm of transverse colon: Secondary | ICD-10-CM | POA: Diagnosis not present

## 2017-12-30 MED ORDER — SODIUM CHLORIDE 0.9 % IV SOLN: 500.0000 mL | Freq: Once | INTRAVENOUS | Status: DC

## 2017-12-30 NOTE — Progress Notes (Signed)
Pt's states no medical or surgical changes since previsit or office visit. 

## 2017-12-30 NOTE — Progress Notes (Signed)
Called to room to assist during endoscopic procedure.  Patient ID and intended procedure confirmed with present staff. Received instructions for my participation in the procedure from the performing physician.  

## 2017-12-30 NOTE — Op Note (Signed)
[place] Patient Name: Jonathan Cohen Procedure Date: 12/30/2017 1:21 PM MRN: [id number] Endoscopist: Ladene Artist , MD Age: 52 Referring MD:  Date of Birth: 11/25/1965 Gender: Male Account #: [id number] Procedure:                Colonoscopy Indications:              Screening in patient at increased risk: Family                            history of 1st-degree relative with colorectal                            cancer before age 54 years Medicines:                Monitored Anesthesia Care Procedure:                Pre-Anesthesia Assessment:                           - Prior to the procedure, a History and Physical                            was performed, and patient medications and                            allergies were reviewed. The patient's tolerance of                            previous anesthesia was also reviewed. The risks                            and benefits of the procedure and the sedation                            options and risks were discussed with the patient.                            All questions were answered, and informed consent                            was obtained. Prior Anticoagulants: The patient has                            taken no previous anticoagulant or antiplatelet                            agents. ASA Grade Assessment: II - A patient with                            mild systemic disease. After reviewing the risks                            and benefits, the patient was deemed in  satisfactory condition to undergo the procedure.                           After obtaining informed consent, the colonoscope                            was passed under direct vision. Throughout the                            procedure, the patient's blood pressure, pulse, and                            oxygen saturations were monitored continuously. The                            Model CF-HQ190L [id number]) scope was  introduced                            through the anus and advanced to the the cecum,                            identified by appendiceal orifice and ileocecal                            valve. The ileocecal valve, appendiceal orifice,                            and rectum were photographed. The quality of the                            bowel preparation was excellent. The colonoscopy                            was performed without difficulty. The patient                            tolerated the procedure well. Scope In: 1:26:44 PM Scope Out: 1:41:17 PM Scope Withdrawal Time: 0 hours 9 minutes 54 seconds  Total Procedure Duration: 0 hours 14 minutes 33 seconds  Findings:                 The perianal and digital rectal examinations were                            normal.                           Two sessile polyps were found in the sigmoid colon                            and transverse colon. The polyps were 6 to 7 mm in                            size. These polyps were removed with a cold snare.  Resection and retrieval were complete.                           A few medium-mouthed diverticula were found in the                            ascending colon and cecum.                           The exam was otherwise without abnormality on                            direct and retroflexion views. Complications:            No immediate complications. Estimated blood loss:                            None. Estimated Blood Loss:     Estimated blood loss: none. Impression:               - Two 6 to 7 mm polyps in the sigmoid colon and in                            the transverse colon, removed with a cold snare.                            Resected and retrieved.                           - Diverticulosis in the ascending colon and in the                            cecum.                           - The examination was otherwise normal on direct                             and retroflexion views. Recommendation:           - Repeat colonoscopy in 5 years for                            surveillance/screening.                           - Patient has a contact number available for                            emergencies. The signs and symptoms of potential                            delayed complications were discussed with the                            patient. Return to normal activities tomorrow.  Written discharge instructions were provided to the                            patient.                           - Resume previous diet.                           - Continue present medications.                           - Await pathology results. Ladene Artist, MD 12/30/2017 1:44:39 PM This report has been signed electronically.

## 2017-12-30 NOTE — Progress Notes (Signed)
To PACU, VSS. Report to [email or web address] 

## 2017-12-30 NOTE — Patient Instructions (Signed)
*  Handout given to care partner on polyps.    YOU HAD AN ENDOSCOPIC PROCEDURE TODAY AT THE [facility]:   Refer to the procedure report that was given to you for any specific questions about what was found during the examination.  If the procedure report does not answer your questions, please call your gastroenterologist to clarify.  If you requested that your care partner not be given the details of your procedure findings, then the procedure report has been included in a sealed envelope for you to review at your convenience later.  YOU SHOULD EXPECT: Some feelings of bloating in the abdomen. Passage of more gas than usual.  Walking can help get rid of the air that was put into your GI tract during the procedure and reduce the bloating. If you had a lower endoscopy (such as a colonoscopy or flexible sigmoidoscopy) you may notice spotting of blood in your stool or on the toilet paper. If you underwent a bowel prep for your procedure, you may not have a normal bowel movement for a few days.  Please Note:  You might notice some irritation and congestion in your nose or some drainage.  This is from the oxygen used during your procedure.  There is no need for concern and it should clear up in a day or so.  SYMPTOMS TO REPORT IMMEDIATELY:   Following lower endoscopy (colonoscopy or flexible sigmoidoscopy):  Excessive amounts of blood in the stool  Significant tenderness or worsening of abdominal pains  Swelling of the abdomen that is new, acute  Fever of 100F or higher   For urgent or emergent issues, a gastroenterologist can be reached at any hour by calling [phone].   DIET:  We do recommend a small meal at first, but then you may proceed to your regular diet.  Drink plenty of fluids but you should avoid alcoholic beverages for 24 hours.  ACTIVITY:  You should plan to take it easy for the rest of today and you should NOT DRIVE or use heavy machinery until tomorrow (because of  the sedation medicines used during the test).    FOLLOW UP: Our staff will call the number listed on your records the next business day following your procedure to check on you and address any questions or concerns that you may have regarding the information given to you following your procedure. If we do not reach you, we will leave a message.  However, if you are feeling well and you are not experiencing any problems, there is no need to return our call.  We will assume that you have returned to your regular daily activities without incident.  If any biopsies were taken you will be contacted by phone or by letter within the next 1-3 weeks.  Please call us at [phone] if you have not heard about the biopsies in 3 weeks.    SIGNATURES/CONFIDENTIALITY: You and/or your care partner have signed paperwork which will be entered into your electronic medical record.  These signatures attest to the fact that that the information above on your After Visit Summary has been reviewed and is understood.  Full responsibility of the confidentiality of this discharge information lies with you and/or your care-partner. 

## 2017-12-31 ENCOUNTER — Telehealth: Payer: Self-pay

## 2017-12-31 NOTE — Telephone Encounter (Signed)
  Follow up Call-  Call back number 12/30/2017  Post procedure Call Back phone  # [phone]  Permission to leave phone message Yes  Some recent data might be hidden     Patient questions:  Do you have a fever, pain , or abdominal swelling? No. Pain Score  0 *  Have you tolerated food without any problems? Yes.    Have you been able to return to your normal activities? Yes.    Do you have any questions about your discharge instructions: Diet   No. Medications  No. Follow up visit  No.  Do you have questions or concerns about your Care? No.  Actions: * If pain score is 4 or above: No action needed, pain <4.

## 2018-01-03 ENCOUNTER — Encounter: Payer: Self-pay | Admitting: Gastroenterology

## 2018-03-11 DIAGNOSIS — R369 Urethral discharge, unspecified: Secondary | ICD-10-CM | POA: Diagnosis not present

## 2018-11-12 ENCOUNTER — Ambulatory Visit (INDEPENDENT_AMBULATORY_CARE_PROVIDER_SITE_OTHER): Payer: 59 | Admitting: Internal Medicine

## 2018-11-12 ENCOUNTER — Encounter: Payer: Self-pay | Admitting: Internal Medicine

## 2018-11-12 VITALS — BP 116/78 | HR 79 | Temp 98.1°F | Resp 16 | Ht 75.0 in | Wt 202.2 lb

## 2018-11-12 DIAGNOSIS — Z23 Encounter for immunization: Secondary | ICD-10-CM | POA: Diagnosis not present

## 2018-11-12 DIAGNOSIS — Z0000 Encounter for general adult medical examination without abnormal findings: Secondary | ICD-10-CM | POA: Diagnosis not present

## 2018-11-12 LAB — COMPREHENSIVE METABOLIC PANEL
ALK PHOS: 80 U/L (ref 39–117)
ALT: 16 U/L (ref 0–53)
AST: 17 U/L (ref 0–37)
Albumin: 4.4 g/dL (ref 3.5–5.2)
BILIRUBIN TOTAL: 0.8 mg/dL (ref 0.2–1.2)
BUN: 16 mg/dL (ref 6–23)
CO2: 30 meq/L (ref 19–32)
CREATININE: 1.18 mg/dL (ref 0.40–1.50)
Calcium: 9.2 mg/dL (ref 8.4–10.5)
Chloride: 104 mEq/L (ref 96–112)
GFR: 78.53 mL/min (ref >60.00)
GLUCOSE: 78 mg/dL (ref 70–99)
Potassium: 4.4 mEq/L (ref 3.5–5.1)
SODIUM: 141 meq/L (ref 135–145)
TOTAL PROTEIN: 6.9 g/dL (ref 6.0–8.3)

## 2018-11-12 LAB — CBC WITH DIFFERENTIAL/PLATELET
BASOS ABS: 0 10*3/uL (ref 0.0–0.1)
Basophils Relative: 0.2 % (ref 0.0–3.0)
Eosinophils Absolute: 0 10*3/uL (ref 0.0–0.7)
Eosinophils Relative: 0.5 % (ref 0.0–5.0)
HCT: 46.9 % (ref 39.0–52.0)
HEMOGLOBIN: 16.1 g/dL (ref 13.0–17.0)
LYMPHS ABS: 2.4 10*3/uL (ref 0.7–4.0)
LYMPHS PCT: 32 % (ref 12.0–46.0)
MCHC: 34.3 g/dL (ref 30.0–36.0)
MCV: 85.4 fl (ref 78.0–100.0)
MONOS PCT: 8 % (ref 3.0–12.0)
Monocytes Absolute: 0.6 10*3/uL (ref 0.1–1.0)
NEUTROS PCT: 59.3 % (ref 43.0–77.0)
Neutro Abs: 4.4 10*3/uL (ref 1.4–7.7)
Platelets: 259 10*3/uL (ref 150.0–400.0)
RBC: 5.49 Mil/uL (ref 4.22–5.81)
RDW: 14.4 % (ref 11.5–15.5)
WBC: 7.4 10*3/uL (ref 4.0–10.5)

## 2018-11-12 LAB — LIPID PANEL
CHOL/HDL RATIO: 5
Cholesterol: 180 mg/dL (ref 0–200)
HDL: 32.9 mg/dL — AB (ref >39.00)
LDL Cholesterol: 125 mg/dL — ABNORMAL HIGH (ref 0–99)
NONHDL: 147.24
Triglycerides: 110 mg/dL (ref 0.0–149.0)
VLDL: 22 mg/dL (ref 0.0–40.0)

## 2018-11-12 NOTE — Progress Notes (Signed)
Pre visit review using our clinic review tool, if applicable. No additional management support is needed unless otherwise documented below in the visit note. 

## 2018-11-12 NOTE — Progress Notes (Signed)
Subjective:    Patient ID: Jonathan Cohen, male    DOB: 12-15-1965, 53 y.o.   MRN: [id number]  DOS:  11/12/2018 Type of visit - description: CPX In general feeling well.  Had a colonoscopy.  Review of Systems Occasionally has swelling on the inner aspect of the right knee, saw orthopedic surgery for the same problem a year ago, notes reviewed. The swelling usually spontaneously resolves and is associated with mild discomfort.  Other than above, a 14 point review of systems is negative   Past Medical History:  Diagnosis Date  . Back injury     Past Surgical History:  Procedure Laterality Date  . LIPOMA EXCISION N/A 11/2016   from posterior  neck     Social History   Socioeconomic History  . Marital status: Married    Spouse name: Not on file  . Number of children: 3  . Years of education: Not on file  . Highest education level: Not on file  Occupational History  . Occupation: IT sales professional: [employer]  Social Needs  . Financial resource strain: Not on file  . Food insecurity:    Worry: Not on file    Inability: Not on file  . Transportation needs:    Medical: Not on file    Non-medical: Not on file  Tobacco Use  . Smoking status: Never Smoker  . Smokeless tobacco: Never Used  Substance and Sexual Activity  . Alcohol use: No  . Drug use: No  . Sexual activity: Not on file  Lifestyle  . Physical activity:    Days per week: Not on file    Minutes per session: Not on file  . Stress: Not on file  Relationships  . Social connections:    Talks on phone: Not on file    Gets together: Not on file    Attends religious service: Not on file    Active member of club or organization: Not on file    Attends meetings of clubs or organizations: Not on file    Relationship status: Not on file  . Intimate partner violence:    Fear of current or ex partner: Not on file    Emotionally abused: Not on file    Physically abused: Not on file    Forced sexual  activity: Not on file  Other Topics Concern  . Not on file  Social History Narrative   Cow[employer] fan!   Household-- pt, wife and 3 children   3 children-- 1999;  2001; 2006     Family History  Problem Relation Age of Onset  . Heart disease Father 35       MI  . Hyperlipidemia Mother   . Colon cancer Sister 25  . Diabetes Neg Hx   . Rectal cancer Neg Hx      Allergies as of 11/12/2018   No Known Allergies     Medication List    as of November 12, 2018 11:59 PM   You have not been prescribed any medications.         Objective:   Physical Exam Musculoskeletal:       Legs:    BP 116/78 (BP Location: Left Arm, Patient Position: Sitting, Cuff Size: Normal)   Pulse 79   Temp 98.1 F (36.7 C) (Oral)   Resp 16   Ht 6\' 3"  (1.905 m)   Wt 202 lb 4 oz (91.7 kg)   SpO2 96%   BMI 25.28  kg/m  General: Well developed, NAD, BMI noted Neck: No  thyromegaly  HEENT:  Normocephalic . Face symmetric, atraumatic Lungs:  CTA B Normal respiratory effort, no intercostal retractions, no accessory muscle use. Heart: RRR,  no murmur.  No pretibial edema bilaterally  Abdomen:  Not distended, soft, non-tender. No rebound or rigidity.   Skin: Exposed areas without rash. Not pale. Not jaundice Neurologic:  alert & oriented X3.  Speech normal, gait appropriate for age and unassisted Strength symmetric and appropriate for age.  Psych: Cognition and judgment appear intact.  Cooperative with normal attention span and concentration.  Behavior appropriate. No anxious or depressed appearing.     Assessment    ASSESSMENT Hyperglycemia: A1c 5.9 (10-2015) Sebaceous cyst, neck  Plan: Hyperglycemia: Last A1c is stable.  Recheck next year Knee pain, swelling:  saw [facility] Ip 09-2017, x-rays were done, they confirmed the presence of a cyst on the inner aspect of the right knee, area was drained, they obtain gelatinous material consistent with a ganglion cyst.  The patient has  problems on and off, for now recommend observation, OTCs, call if symptoms persist. RTC 1 year

## 2018-11-12 NOTE — Assessment & Plan Note (Signed)
-  Td 2020; had a flu shot  -CCS: Cscope 2019, next 12/2022 per GI letter -prostate ca screening DRE-PSA  wnl 2019 -Labs : CMP, FLP, CB    -Diet exercise: needs to increase physical activity, counseled

## 2018-11-12 NOTE — Patient Instructions (Signed)
GO TO THE LAB : Get the blood work     GO TO THE FRONT DESK Schedule your next appointment   for physical exam in 1 year

## 2018-11-13 NOTE — Assessment & Plan Note (Signed)
Hyperglycemia: Last A1c is stable.  Recheck next year Knee pain, swelling:  saw Ralie[facility] Ip 09-2017, x-rays were done, they confirmed the presence of a cyst on the inner aspect of the right knee, area was drained, they obtain gelatinous material consistent with a ganglion cyst.  The patient has problems on and off, for now recommend observation, OTCs, call if symptoms persist. RTC 1 year

## 2019-05-28 ENCOUNTER — Other Ambulatory Visit: Payer: Self-pay

## 2019-05-28 ENCOUNTER — Ambulatory Visit (INDEPENDENT_AMBULATORY_CARE_PROVIDER_SITE_OTHER): Payer: 59 | Admitting: Internal Medicine

## 2019-05-28 DIAGNOSIS — B349 Viral infection, unspecified: Secondary | ICD-10-CM | POA: Diagnosis not present

## 2019-05-28 NOTE — Progress Notes (Signed)
Subjective:    Patient ID: Jonathan Cohen, male    DOB: 1966/01/07, 53 y.o.   MRN: [id number]  DOS:  05/28/2019 Type of visit - description: Virtual Visit via Video Note  I connected with@   by a video enabled telemedicine application and verified that I am speaking with the correct person using two identifiers.   THIS ENCOUNTER IS A VIRTUAL VISIT DUE TO COVID-19 - PATIENT WAS NOT SEEN IN THE OFFICE. PATIENT HAS CONSENTED TO VIRTUAL VISIT / TELEMEDICINE VISIT   Location of patient: home  Location of provider: office  I discussed the limitations of evaluation and management by telemedicine and the availability of in person appointments. The patient expressed understanding and agreed to proceed.  History of Present Illness: Acute visit Symptoms started early on 05/26/2019: Moderate headache, body aches, chills. The headache responds somewhat to Tylenol and decreases when he lays down. Interestingly, on 05/23/2019 he had a free COVID-19 test done for screening .  At the time he had no symptoms.  The results are pending. No known COVID-19 exposure.    Review of Systems Denies actual fevers No nausea, vomiting, diarrhea No lack of smell or taste No chest pain, difficulty breathing.  No cough Mild sinus congestion.  No sore throat. No rash  Past Medical History:  Diagnosis Date  . Back injury     Past Surgical History:  Procedure Laterality Date  . LIPOMA EXCISION N/A 11/2016   from posterior  neck     Social History   Socioeconomic History  . Marital status: Married    Spouse name: Not on file  . Number of children: 3  . Years of education: Not on file  . Highest education level: Not on file  Occupational History  . Occupation: IT sales professional: [employer]  Social Needs  . Financial resource strain: Not on file  . Food insecurity    Worry: Not on file    Inability: Not on file  . Transportation needs    Medical: Not on file    Non-medical: Not on file   Tobacco Use  . Smoking status: Never Smoker  . Smokeless tobacco: Never Used  Substance and Sexual Activity  . Alcohol use: No  . Drug use: No  . Sexual activity: Not on file  Lifestyle  . Physical activity    Days per week: Not on file    Minutes per session: Not on file  . Stress: Not on file  Relationships  . Social Herbalist on phone: Not on file    Gets together: Not on file    Attends religious service: Not on file    Active member of club or organization: Not on file    Attends meetings of clubs or organizations: Not on file    Relationship status: Not on file  . Intimate partner violence    Fear of current or ex partner: Not on file    Emotionally abused: Not on file    Physically abused: Not on file    Forced sexual activity: Not on file  Other Topics Concern  . Not on file  Social History Narrative   Cowboys fan!   Household-- pt, wife and 3 children   3 children-- 1999;  2001; 2006      Allergies as of 05/28/2019   No Known Allergies     Medication List    as of May 28, 2019  3:33 PM  You have not been prescribed any medications.         Objective:   Physical Exam There were no vitals taken for this visit. This is a virtual video visit, he is alert oriented x3, no apparent distress.  Speaking in complete sentences.    Assessment     ASSESSMENT Hyperglycemia: A1c 5.9 (10-2015) Sebaceous cyst, neck  Plan: Viral syndrome Sxs c/w viral syndrome, nontoxic-appearing, no red flag symptoms. Could be  COVID-19. He hasd a covid testing done prior to the onset of symptoms and the results are pending. Plan: Rest, fluids, Tylenol as needed. Red flag symptoms discussed including more intense and persisting headache, a rash, chest pain or shortness of breath.  If any of that happen he needs to go to the ER or at least let us know. Encourage strict quarantine including stay in different rooms at home and if he must be in the same room  with his family use a mask. Recommend not to go to work for the next 10 days and if he is better.  A work note was provided. He verbalized understanding    I discussed the assessment and treatment plan with the patient. The patient was provided an opportunity to ask questions and all were answered. The patient agreed with the plan and demonstrated an understanding of the instructions.   The patient was advised to call back or seek an in-person evaluation if the symptoms worsen or if the condition fails to improve as anticipated.

## 2019-05-30 NOTE — Assessment & Plan Note (Signed)
Viral syndrome Sxs c/w viral syndrome, nontoxic-appearing, no red flag symptoms. Could be  COVID-19. He hasd a covid testing done prior to the onset of symptoms and the results are pending. Plan: Rest, fluids, Tylenol as needed. Red flag symptoms discussed including more intense and persisting headache, a rash, chest pain or shortness of breath.  If any of that happen he needs to go to the ER or at least let us know. Encourage strict quarantine including stay in different rooms at home and if he must be in the same room with his family use a mask. Recommend not to go to work for the next 10 days and if he is better.  A work note was provided. He verbalized understanding

## 2019-05-31 ENCOUNTER — Emergency Department (HOSPITAL_BASED_OUTPATIENT_CLINIC_OR_DEPARTMENT_OTHER)
Admission: EM | Admit: 2019-05-31 | Discharge: 2019-05-31 | Disposition: A | Discharge status: Home / Self Care | Payer: 59 | Attending: Emergency Medicine | Admitting: Emergency Medicine

## 2019-05-31 ENCOUNTER — Encounter (HOSPITAL_BASED_OUTPATIENT_CLINIC_OR_DEPARTMENT_OTHER): Payer: Self-pay | Admitting: *Deleted

## 2019-05-31 ENCOUNTER — Other Ambulatory Visit: Payer: Self-pay

## 2019-05-31 ENCOUNTER — Emergency Department (HOSPITAL_BASED_OUTPATIENT_CLINIC_OR_DEPARTMENT_OTHER): Payer: 59

## 2019-05-31 DIAGNOSIS — U071 COVID-19: Secondary | ICD-10-CM | POA: Insufficient documentation

## 2019-05-31 DIAGNOSIS — R109 Unspecified abdominal pain: Secondary | ICD-10-CM | POA: Insufficient documentation

## 2019-05-31 DIAGNOSIS — J189 Pneumonia, unspecified organism: Secondary | ICD-10-CM | POA: Diagnosis not present

## 2019-05-31 DIAGNOSIS — R509 Fever, unspecified: Secondary | ICD-10-CM | POA: Diagnosis present

## 2019-05-31 LAB — CBC WITH DIFFERENTIAL/PLATELET
Abs Immature Granulocytes: 0.02 10*3/uL (ref 0.00–0.07)
Basophils Absolute: 0 10*3/uL (ref 0.0–0.1)
Basophils Relative: 0 %
Eosinophils Absolute: 0 10*3/uL (ref 0.0–0.5)
Eosinophils Relative: 0 %
HCT: 43 % (ref 39.0–52.0)
Hemoglobin: 15.1 g/dL (ref 13.0–17.0)
Immature Granulocytes: 0 %
Lymphocytes Relative: 25 %
Lymphs Abs: 1.2 10*3/uL (ref 0.7–4.0)
MCH: 28.8 pg (ref 26.0–34.0)
MCHC: 35.1 g/dL (ref 30.0–36.0)
MCV: 82.1 fL (ref 80.0–100.0)
Monocytes Absolute: 0.4 10*3/uL (ref 0.1–1.0)
Monocytes Relative: 7 %
Neutro Abs: 3.4 10*3/uL (ref 1.7–7.7)
Neutrophils Relative %: 68 %
Platelets: 174 10*3/uL (ref 150–400)
RBC: 5.24 MIL/uL (ref 4.22–5.81)
RDW: 13.9 % (ref 11.5–15.5)
WBC: 5 10*3/uL (ref 4.0–10.5)
nRBC: 0 % (ref 0.0–0.2)

## 2019-05-31 LAB — URINALYSIS, ROUTINE W REFLEX MICROSCOPIC
Bilirubin Urine: NEGATIVE
Glucose, UA: NEGATIVE mg/dL
Ketones, ur: 15 mg/dL — AB
Leukocytes,Ua: NEGATIVE
Nitrite: NEGATIVE
Protein, ur: 100 mg/dL — AB
Specific Gravity, Urine: 1.01 (ref 1.005–1.030)
pH: 7 (ref 5.0–8.0)

## 2019-05-31 LAB — COMPREHENSIVE METABOLIC PANEL
ALT: 31 U/L (ref 0–44)
AST: 44 U/L — ABNORMAL HIGH (ref 15–41)
Albumin: 3.8 g/dL (ref 3.5–5.0)
Alkaline Phosphatase: 59 U/L (ref 38–126)
Anion gap: 11 (ref 5–15)
BUN: 14 mg/dL (ref 6–20)
CO2: 24 mmol/L (ref 22–32)
Calcium: 8.7 mg/dL — ABNORMAL LOW (ref 8.9–10.3)
Chloride: 96 mmol/L — ABNORMAL LOW (ref 98–111)
Creatinine, Ser: 1.14 mg/dL (ref 0.61–1.24)
GFR calc Af Amer: >60 mL/min (ref >60)
GFR calc non Af Amer: >60 mL/min (ref >60)
Glucose, Bld: 99 mg/dL (ref 70–99)
Potassium: 4.1 mmol/L (ref 3.5–5.1)
Sodium: 131 mmol/L — ABNORMAL LOW (ref 135–145)
Total Bilirubin: 0.7 mg/dL (ref 0.3–1.2)
Total Protein: 7.4 g/dL (ref 6.5–8.1)

## 2019-05-31 LAB — URINALYSIS, MICROSCOPIC (REFLEX)
Bacteria, UA: NONE SEEN
Squamous Epithelial / HPF: NONE SEEN (ref 0–5)
WBC, UA: NONE SEEN WBC/hpf (ref 0–5)

## 2019-05-31 LAB — LACTIC ACID, PLASMA: Lactic Acid, Venous: 1.1 mmol/L (ref 0.5–1.9)

## 2019-05-31 LAB — LIPASE, BLOOD: Lipase: 31 U/L (ref 11–51)

## 2019-05-31 MED ORDER — PREDNISONE 10 MG PO TABS: 40.0000 mg | ORAL_TABLET | Freq: Every day | ORAL | 0 refills | Status: AC

## 2019-05-31 MED ORDER — DOXYCYCLINE HYCLATE 100 MG PO TABS
100.0000 mg | ORAL_TABLET | Freq: Once | ORAL | Status: AC
  Administered 2019-05-31: 100 mg via ORAL
  Filled 2019-05-31: qty 1

## 2019-05-31 MED ORDER — ACETAMINOPHEN 325 MG PO TABS
650.0000 mg | ORAL_TABLET | Freq: Once | ORAL | Status: AC | PRN
  Administered 2019-05-31: 650 mg via ORAL
  Filled 2019-05-31: qty 2

## 2019-05-31 MED ORDER — DOXYCYCLINE HYCLATE 100 MG PO CAPS: 100.0000 mg | ORAL_CAPSULE | Freq: Two times a day (BID) | ORAL | 0 refills | Status: AC

## 2019-05-31 MED ORDER — SODIUM CHLORIDE 0.9 % IV BOLUS
1000.0000 mL | Freq: Once | INTRAVENOUS | Status: AC
  Administered 2019-05-31: 22:00:00 1000 mL via INTRAVENOUS

## 2019-05-31 MED ORDER — PREDNISONE 50 MG PO TABS
60.0000 mg | ORAL_TABLET | Freq: Once | ORAL | Status: AC
  Administered 2019-05-31: 23:00:00 60 mg via ORAL
  Filled 2019-05-31: qty 1

## 2019-05-31 MED ORDER — IOHEXOL 300 MG/ML  SOLN
100.0000 mL | Freq: Once | INTRAMUSCULAR | Status: AC | PRN
  Administered 2019-05-31: 23:00:00 100 mL via INTRAVENOUS

## 2019-05-31 NOTE — Discharge Instructions (Signed)
You were seen in the emergency department today with a coronavirus-like illness.  Your x-ray shows pneumonia and I am starting you initially on antibiotic and steroid.  You can follow the COVID-19 results in your phone on the MyChart app.  If the test comes back positive you can discontinue the antibiotics but continue the steroid.  If you develop shortness of breath, chest pain, lightheadedness, confusion you need to return to emergency department immediately.  Please stay isolated at home and maintain distance from others.  He can follow-up with your PCP by telehealth.

## 2019-05-31 NOTE — ED Notes (Signed)
Family at bedside. 

## 2019-05-31 NOTE — ED Triage Notes (Signed)
Pt reports fever, back pain, and body aches since Tuesday

## 2019-05-31 NOTE — ED Notes (Signed)
Pt reports fevers/body aches x 5 days. Pt denies known exposure to covid. Pt denies cough/sore throat/ diarrhea/ vomiting/ SOB.

## 2019-05-31 NOTE — ED Provider Notes (Signed)
Emergency Department Provider Note   I have reviewed the triage vital signs and the nursing notes.   HISTORY  Chief Complaint Generalized Body Aches   HPI Jonathan Cohen is a 53 y.o. male presents to the ED with fever, body aches for the last 5 days. Wife with similar symptoms but resolved. Patient denies cough, congestion, sore throat, or anosmia. Patient has had some persistent right flank/abdomen pain for the last 3 weeks but this pre-dates fever symptoms. He has been in contact with PCP and trying conservative mgmt at home but symptoms continue. Mild HA noted. Patient's wife is a Marine scientist and both patient and wife tested negative for COVID last week but not since developing symptoms. No radiation of symptoms or modifying factors.   Past Medical History:  Diagnosis Date   Back injury     Patient Active Problem List   Diagnosis Date Noted   PCP NOTES >>>>>>>>>>>>>>>>>>>>>>>>>>>>>. 10/31/2015   Annual physical exam 09/13/2011    Past Surgical History:  Procedure Laterality Date   LIPOMA EXCISION N/A 11/2016   from posterior  neck     Allergies Patient has no known allergies.  Family History  Problem Relation Age of Onset   Heart disease Father 25       MI   Hyperlipidemia Mother    Colon cancer Sister 79   Diabetes Neg Hx    Rectal cancer Neg Hx     Social History Social History   Tobacco Use   Smoking status: Never Smoker   Smokeless tobacco: Never Used  Substance Use Topics   Alcohol use: No   Drug use: No    Review of Systems  Constitutional: Positive fever and body aches.  Eyes: No visual changes. ENT: No sore throat. Cardiovascular: Denies chest pain. Respiratory: Denies shortness of breath. Gastrointestinal: Positive right flank/abdominal pain.  No nausea, no vomiting.  No diarrhea.  No constipation. Genitourinary: Negative for dysuria. Musculoskeletal: Negative for back pain. Skin: Negative for rash. Neurological: Negative for  focal weakness or numbness. Positive HA.   10-point ROS otherwise negative.  ____________________________________________   PHYSICAL EXAM:  VITAL SIGNS: ED Triage Vitals  Enc Vitals Group     BP 05/31/19 2046 128/84     Pulse Rate 05/31/19 2046 (!) 103     Resp 05/31/19 2046 18     Temp 05/31/19 2046 (!) 101 F (38.3 C)     Temp Source 05/31/19 2046 Oral     SpO2 05/31/19 2046 96 %     Weight 05/31/19 2049 193 lb (87.5 kg)     Height 05/31/19 2049 6\' 3"  (1.905 m)   Constitutional: Alert and oriented. Well appearing and in no acute distress. Eyes: Conjunctivae are normal.  Head: Atraumatic. Nose: No congestion/rhinnorhea. Mouth/Throat: Mucous membranes are moist.   Neck: No stridor.  Cardiovascular: Normal rate, regular rhythm. Good peripheral circulation. Grossly normal heart sounds.   Respiratory: Normal respiratory effort.  No retractions. Lungs CTAB. Gastrointestinal: Soft and nontender. No distention.  Musculoskeletal: No lower extremity tenderness nor edema. No gross deformities of extremities. Neurologic:  Normal speech and language. No gross focal neurologic deficits are appreciated.  Skin:  Skin is warm, dry and intact. No rash noted.   ____________________________________________   LABS (all labs ordered are listed, but only abnormal results are displayed)  Labs Reviewed  SARS CORONAVIRUS 2 (TAT 6-24 HRS) - Abnormal; Notable for the following components:      Result Value   SARS Coronavirus 2 POSITIVE (*)  All other components within normal limits  COMPREHENSIVE METABOLIC PANEL - Abnormal; Notable for the following components:   Sodium 131 (*)    Chloride 96 (*)    Calcium 8.7 (*)    AST 44 (*)    All other components within normal limits  URINALYSIS, ROUTINE W REFLEX MICROSCOPIC - Abnormal; Notable for the following components:   Hgb urine dipstick TRACE (*)    Ketones, ur 15 (*)    Protein, ur 100 (*)    All other components within normal limits    CULTURE, BLOOD (ROUTINE X 2)  CULTURE, BLOOD (ROUTINE X 2)  LIPASE, BLOOD  LACTIC ACID, PLASMA  CBC WITH DIFFERENTIAL/PLATELET  URINALYSIS, MICROSCOPIC (REFLEX)   ____________________________________________  RADIOLOGY  Dg Chest 2 View  Result Date: 05/31/2019 CLINICAL DATA:  53 year old male with fever body ache and back pain for 5 days. EXAM: CHEST - 2 VIEW COMPARISON:  None. FINDINGS: Normal cardiac size and mediastinal contours. Visualized tracheal air column is within normal limits. Somewhat low lung volumes. Patchy bilateral abnormal pulmonary opacity. Elevated left hemidiaphragm with associated more confluent left lung base opacity. But no evidence of pleural effusion. No pulmonary edema or pneumothorax. Negative visible bowel gas pattern. No acute osseous abnormality identified. IMPRESSION: Patchy and indistinct bilateral pulmonary opacity, possibly with superimposed left lung base consolidation. But no pleural effusion. Favor multifocal pneumonia, and consider acute viral/atypical etiology. Electronically Signed   By: Genevie Ann M.D.   On: 05/31/2019 23:00   Ct Abdomen Pelvis W Contrast  Result Date: 05/31/2019 CLINICAL DATA:  Abdominal pain with fever EXAM: CT ABDOMEN AND PELVIS WITH CONTRAST TECHNIQUE: Multidetector CT imaging of the abdomen and pelvis was performed using the standard protocol following bolus administration of intravenous contrast. CONTRAST:  112mL OMNIPAQUE IOHEXOL 300 MG/ML  SOLN COMPARISON:  None. FINDINGS: Lower chest: Lung bases demonstrate patchy consolidations and ground-glass densities within the bilateral lower lobes, lingula, and right middle lobe. No pleural effusion. Normal heart size. Small amount of gynecomastia Hepatobiliary: No focal liver abnormality is seen. No gallstones, gallbladder wall thickening, or biliary dilatation. Pancreas: Unremarkable. No pancreatic ductal dilatation or surrounding inflammatory changes. Spleen: Normal in size without focal  abnormality. Adrenals/Urinary Tract: Adrenal glands are unremarkable. Kidneys are normal, without renal calculi, focal lesion, or hydronephrosis. Bladder is unremarkable. Stomach/Bowel: Stomach is within normal limits. Appendix appears normal. No evidence of bowel wall thickening, distention, or inflammatory changes. Vascular/Lymphatic: Mild aortic atherosclerosis. No aneurysm. No significantly enlarged lymph nodes. Reproductive: Enlarged prostate with mass effect on the bladder Other: Negative for free air or free fluid Musculoskeletal: No acute or significant osseous findings. IMPRESSION: 1. Patchy consolidations and ground-glass densities within the bilateral lower lobes, lingula and right middle lobe suspicious for pneumonia. Slightly subpleural distribution of ground-glass density on the right raises concern for atypical or viral pneumonia. 2. Prostatomegaly 3. Otherwise no CT evidence for acute intra-abdominal or pelvic abnormality. Electronically Signed   By: Donavan Foil M.D.   On: 05/31/2019 22:55    ____________________________________________   PROCEDURES  Procedure(s) performed:   Procedures  None ____________________________________________   INITIAL IMPRESSION / ASSESSMENT AND PLAN / ED COURSE  Pertinent labs & imaging results that were available during my care of the patient were reviewed by me and considered in my medical decision making (see chart for details).   Patient presents to the ED with fever, body aches, and right flank pain going on longer than symptoms and possibly unrelated. With fever here, CT obtained to r/o infectious  process. CT shows multifocal PNA. COVID suspected but test will not result for 24 hours. Starting abx here but will discontinue if COVID positive. No respiratory symptoms or hypoxemia. Wife will alert her work as well. Discussed ED return precautions and isolation precautions.    ____________________________________________  FINAL CLINICAL  IMPRESSION(S) / ED DIAGNOSES  Final diagnoses:  Multifocal pneumonia  COVID-19     MEDICATIONS GIVEN DURING THIS VISIT:  Medications  acetaminophen (TYLENOL) tablet 650 mg (650 mg Oral Given 05/31/19 2102)  sodium chloride 0.9 % bolus 1,000 mL (0 mLs Intravenous Stopped 05/31/19 2257)  iohexol (OMNIPAQUE) 300 MG/ML solution 100 mL (100 mLs Intravenous Contrast Given 05/31/19 2233)  predniSONE (DELTASONE) tablet 60 mg (60 mg Oral Given 05/31/19 2317)  doxycycline (VIBRA-TABS) tablet 100 mg (100 mg Oral Given 05/31/19 2317)     NEW OUTPATIENT MEDICATIONS STARTED DURING THIS VISIT:  Discharge Medication List as of 05/31/2019 11:18 PM    START taking these medications   Details  doxycycline (VIBRAMYCIN) 100 MG capsule Take 1 capsule (100 mg total) by mouth 2 (two) times daily for 7 days., Starting Sun 05/31/2019, Until Sun 06/07/2019, Print    predniSONE (DELTASONE) 10 MG tablet Take 4 tablets (40 mg total) by mouth daily for 4 days., Starting Mon 06/01/2019, Until Fri 06/05/2019, Print        Note:  This document was prepared using Dragon voice recognition software and may include unintentional dictation errors.  Nanda Quinton, MD Emergency Medicine    Kieron Kantner, [facility], MD 06/01/19 2005

## 2019-05-31 NOTE — ED Notes (Signed)
Patient transported to CT 

## 2019-06-01 ENCOUNTER — Telehealth: Payer: Self-pay | Admitting: Internal Medicine

## 2019-06-01 LAB — SARS CORONAVIRUS 2 (TAT 6-24 HRS): SARS Coronavirus 2: POSITIVE — AB

## 2019-06-01 NOTE — Telephone Encounter (Signed)
Schedule a ER follow-up, virtual, in 2 to 3 days.  Sooner if he is not getting better.

## 2019-06-02 NOTE — Telephone Encounter (Signed)
Needs ED f/u please.  

## 2019-06-03 ENCOUNTER — Ambulatory Visit (INDEPENDENT_AMBULATORY_CARE_PROVIDER_SITE_OTHER): Payer: 59 | Admitting: Internal Medicine

## 2019-06-03 ENCOUNTER — Other Ambulatory Visit: Payer: Self-pay

## 2019-06-03 VITALS — BP 110/90 | HR 92 | Temp 99.1°F | Resp 20 | Wt 194.0 lb

## 2019-06-03 DIAGNOSIS — U071 COVID-19: Secondary | ICD-10-CM

## 2019-06-03 DIAGNOSIS — J1289 Other viral pneumonia: Secondary | ICD-10-CM | POA: Diagnosis not present

## 2019-06-03 NOTE — Progress Notes (Signed)
Wants to know should he continue taking the medication

## 2019-06-03 NOTE — Progress Notes (Signed)
Subjective:    Patient ID: Jonathan Cohen, male    DOB: 28-Jul-1966, 52 y.o.   MRN: [id number]  DOS:  06/03/2019 Type of visit - description:Virtual Visit via Video Note  I connected with@   by a video enabled telemedicine application and verified that I am speaking with the correct person using two identifiers.   THIS ENCOUNTER IS A VIRTUAL VISIT DUE TO COVID-19 - PATIENT WAS NOT SEEN IN THE OFFICE. PATIENT HAS CONSENTED TO VIRTUAL VISIT / TELEMEDICINE VISIT   Location of patient: home  Location of provider: office  I discussed the limitations of evaluation and management by telemedicine and the availability of in person appointments. The patient expressed understanding and agreed to proceed.  History of Present Illness:   ER follow-up Symptoms started 05/26/2019 and he was seen at this office 05/28/2019. He had a viral syndrome, eventually went to the ER 05/31/2019 because he was feeling worse. Fever, very significant sweats, generalized aches, decreased appetite, a headache, mild shortness of breath. ER work-up reviewed: Chest x-ray and CT abdomen showed multifocal pneumonia. CBC normal, mild hyponatremia, blood cultures so far negative. O2 sat 96% COVID-19 testing came back positive. He was recommended doxycycline and prednisone which he is still taking.   Review of Systems Today he reports he is still having sweats. Temperature today 99.1. O2 sat today at home 93% and 90% earlier today. He had mild shortness of breath, that is a slightly better today for the first time. Denies cough, chest pain. No nausea, vomiting, diarrhea No rash  Past Medical History:  Diagnosis Date  . Back injury     Past Surgical History:  Procedure Laterality Date  . LIPOMA EXCISION N/A 11/2016   from posterior  neck     Social History   Socioeconomic History  . Marital status: Married    Spouse name: Not on file  . Number of children: 3  . Years of education: Not on file  . Highest  education level: Not on file  Occupational History  . Occupation: IT sales professional: [employer]  Social Needs  . Financial resource strain: Not on file  . Food insecurity    Worry: Not on file    Inability: Not on file  . Transportation needs    Medical: Not on file    Non-medical: Not on file  Tobacco Use  . Smoking status: Never Smoker  . Smokeless tobacco: Never Used  Substance and Sexual Activity  . Alcohol use: No  . Drug use: No  . Sexual activity: Not on file  Lifestyle  . Physical activity    Days per week: Not on file    Minutes per session: Not on file  . Stress: Not on file  Relationships  . Social Herbalist on phone: Not on file    Gets together: Not on file    Attends religious service: Not on file    Active member of club or organization: Not on file    Attends meetings of clubs or organizations: Not on file    Relationship status: Not on file  . Intimate partner violence    Fear of current or ex partner: Not on file    Emotionally abused: Not on file    Physically abused: Not on file    Forced sexual activity: Not on file  Other Topics Concern  . Not on file  Social History Narrative   C[employer] fan!   Household-- pt,  wife and 3 children   3 children-- 1999;  2001; 2006      Allergies as of 06/03/2019   No Known Allergies     Medication List       Accurate as of June 03, 2019 11:41 AM. If you have any questions, ask your nurse or doctor.        doxycycline 100 MG capsule Commonly known as: VIBRAMYCIN Take 1 capsule (100 mg total) by mouth 2 (two) times daily for 7 days.   predniSONE 10 MG tablet Commonly known as: DELTASONE Take 4 tablets (40 mg total) by mouth daily for 4 days.           Objective:   Physical Exam This is a virtual video visit, he is alert oriented x3, he is in no distress, moving around the house without problems, speaking in complete sentences.    Assessment     ASSESSMENT Hyperglycemia:  A1c 5.9 (10-2015) Sebaceous cyst, neck  Plan: COVID-19 with pneumonia: The patient was diagnosed with COVID-19 3 days ago, has multifocal pneumonia by chest x-ray and CT. He is on doxycycline prednisone (will finish prednisone tomorrow). Subjectively, shortness of breath is slightly better today, he still has a low-grade temperature and sweats. I am somewhat concerned about O2 sat of 93% and 90% today. Plan: Continue checking temperatures at least once a day Check O2 sat 3-4 times a day, if it is consistently below 94% needs to call or go to the ER Tylenol 500 mg: 2 tablets tid Good hydration Finish doxycycline and prednisone Strict precautions for 10 days and until he feels better I emphasized the need to call or go to the ER if he feels clinically worse or if the O2 sat is low. Will arrange another virtual visit in 4-5  days to check on him.  I discussed the assessment and treatment plan with the patient. The patient was provided an opportunity to ask questions and all were answered. The patient agreed with the plan and demonstrated an understanding of the instructions.   The patient was advised to call back or seek an in-person evaluation if the symptoms worsen or if the condition fails to improve as anticipated.

## 2019-06-04 NOTE — Assessment & Plan Note (Signed)
COVID-19 with pneumonia: The patient was diagnosed with COVID-19 3 days ago, has multifocal pneumonia by chest x-ray and CT. He is on doxycycline prednisone (will finish prednisone tomorrow). Subjectively, shortness of breath is slightly better today, he still has a low-grade temperature and sweats. I am somewhat concerned about O2 sat of 93% and 90% today. Plan: Continue checking temperatures at least once a day Check O2 sat 3-4 times a day, if it is consistently below 94% needs to call or go to the ER Tylenol 500 mg: 2 tablets tid Good hydration Finish doxycycline and prednisone Strict precautions for 10 days and until he feels better I emphasized the need to call or go to the ER if he feels clinically worse or if the O2 sat is low. Will arrange another virtual visit in 4-5  days to check on him.

## 2019-06-05 ENCOUNTER — Encounter: Payer: Self-pay | Admitting: Internal Medicine

## 2019-06-06 LAB — CULTURE, BLOOD (ROUTINE X 2)
Culture: NO GROWTH
Culture: NO GROWTH
Special Requests: ADEQUATE

## 2019-06-08 ENCOUNTER — Ambulatory Visit (INDEPENDENT_AMBULATORY_CARE_PROVIDER_SITE_OTHER): Payer: 59 | Admitting: Internal Medicine

## 2019-06-08 ENCOUNTER — Other Ambulatory Visit: Payer: Self-pay

## 2019-06-08 DIAGNOSIS — J1289 Other viral pneumonia: Secondary | ICD-10-CM | POA: Diagnosis not present

## 2019-06-08 DIAGNOSIS — U071 COVID-19: Secondary | ICD-10-CM | POA: Diagnosis not present

## 2019-06-08 NOTE — Progress Notes (Signed)
Subjective:    Patient ID: Jonathan Cohen, male    DOB: 13-Aug-1966, 53 y.o.   MRN: [id number]  DOS:  06/08/2019 Type of visit - description: Virtual Visit via Video Note  I connected with@   by a video enabled telemedicine application and verified that I am speaking with the correct person using two identifiers.   THIS ENCOUNTER IS A VIRTUAL VISIT DUE TO COVID-19 - PATIENT WAS NOT SEEN IN THE OFFICE. PATIENT HAS CONSENTED TO VIRTUAL VISIT / TELEMEDICINE VISIT   Location of patient: home  Location of provider: office  I discussed the limitations of evaluation and management by telemedicine and the availability of in person appointments. The patient expressed understanding and agreed to proceed.  History of Present Illness: Follow-up Since the last office visit he is doing great. Has no further fever or chills, off Tylenol No chest pain no difficulty breathing O2 sats 96 to 97%, temperature today 96.8.   Review of Systems  Denies nausea, vomiting, diarrhea No aches or sweats.  Past Medical History:  Diagnosis Date  . Back injury     Past Surgical History:  Procedure Laterality Date  . LIPOMA EXCISION N/A 11/2016   from posterior  neck     Social History   Socioeconomic History  . Marital status: Married    Spouse name: Not on file  . Number of children: 3  . Years of education: Not on file  . Highest education level: Not on file  Occupational History  . Occupation: IT sales professional: [employer]  Social Needs  . Financial resource strain: Not on file  . Food insecurity    Worry: Not on file    Inability: Not on file  . Transportation needs    Medical: Not on file    Non-medical: Not on file  Tobacco Use  . Smoking status: Never Smoker  . Smokeless tobacco: Never Used  Substance and Sexual Activity  . Alcohol use: No  . Drug use: No  . Sexual activity: Not on file  Lifestyle  . Physical activity    Days per week: Not on file    Minutes per  session: Not on file  . Stress: Not on file  Relationships  . Social Herbalist on phone: Not on file    Gets together: Not on file    Attends religious service: Not on file    Active member of club or organization: Not on file    Attends meetings of clubs or organizations: Not on file    Relationship status: Not on file  . Intimate partner violence    Fear of current or ex partner: Not on file    Emotionally abused: Not on file    Physically abused: Not on file    Forced sexual activity: Not on file  Other Topics Concern  . Not on file  Social History Narrative   [employer] fan!   Household-- pt, wife and 3 children   3 children-- 1999;  2001; 2006      Allergies as of 06/08/2019   No Known Allergies     Medication List    as of June 08, 2019 10:28 AM   You have not been prescribed any medications.         Objective:   Physical Exam There were no vitals taken for this visit. This is a virtual video visit, he is alert oriented x3, in no apparent distress  Assessment     ASSESSMENT Hyperglycemia: A1c 5.9 (10-2015) Sebaceous cyst, neck  Plan: COVID-19 with pneumonia: Since the last visit he is doing great, no further fever, off Tylenol.  He feels ready to go back to work. Letter for release sent to his HR person. Recommend to continue covid 19 precautions and call if symptoms resurface Follow-up already scheduled for 11-2019  10 min   I discussed the assessment and treatment plan with the patient. The patient was provided an opportunity to ask questions and all were answered. The patient agreed with the plan and demonstrated an understanding of the instructions.   The patient was advised to call back or seek an in-person evaluation if the symptoms worsen or if the condition fails to improve as anticipated.

## 2019-06-08 NOTE — Assessment & Plan Note (Signed)
COVID-19 with pneumonia: Since the last visit he is doing great, no further fever, off Tylenol.  He feels ready to go back to work. Letter for release sent to his HR person. Recommend to continue covid 19 precautions and call if symptoms resurface Follow-up already scheduled for 11-2019

## 2019-11-16 ENCOUNTER — Encounter: Payer: Self-pay | Admitting: Internal Medicine

## 2019-11-20 ENCOUNTER — Encounter: Payer: Self-pay | Admitting: Internal Medicine

## 2019-11-20 ENCOUNTER — Other Ambulatory Visit: Payer: Self-pay

## 2019-11-20 ENCOUNTER — Ambulatory Visit (INDEPENDENT_AMBULATORY_CARE_PROVIDER_SITE_OTHER): Payer: 59 | Admitting: Internal Medicine

## 2019-11-20 VITALS — BP 135/95 | HR 84 | Temp 97.2°F | Resp 16 | Ht 75.0 in | Wt 202.4 lb

## 2019-11-20 DIAGNOSIS — Z0000 Encounter for general adult medical examination without abnormal findings: Secondary | ICD-10-CM

## 2019-11-20 LAB — PSA: PSA: 1.43 ng/mL (ref 0.10–4.00)

## 2019-11-20 LAB — COMPREHENSIVE METABOLIC PANEL
ALT: 19 U/L (ref 0–53)
AST: 23 U/L (ref 0–37)
Albumin: 4.2 g/dL (ref 3.5–5.2)
Alkaline Phosphatase: 77 U/L (ref 39–117)
BUN: 16 mg/dL (ref 6–23)
CO2: 29 mEq/L (ref 19–32)
Calcium: 9.6 mg/dL (ref 8.4–10.5)
Chloride: 103 mEq/L (ref 96–112)
Creatinine, Ser: 1.09 mg/dL (ref 0.40–1.50)
GFR: 85.72 mL/min (ref >60.00)
Glucose, Bld: 92 mg/dL (ref 70–99)
Potassium: 4 mEq/L (ref 3.5–5.1)
Sodium: 138 mEq/L (ref 135–145)
Total Bilirubin: 0.9 mg/dL (ref 0.2–1.2)
Total Protein: 7 g/dL (ref 6.0–8.3)

## 2019-11-20 LAB — LIPID PANEL
Cholesterol: 188 mg/dL (ref 0–200)
HDL: 30.9 mg/dL — ABNORMAL LOW (ref >39.00)
LDL Cholesterol: 136 mg/dL — ABNORMAL HIGH (ref 0–99)
NonHDL: 156.86
Total CHOL/HDL Ratio: 6
Triglycerides: 105 mg/dL (ref 0.0–149.0)
VLDL: 21 mg/dL (ref 0.0–40.0)

## 2019-11-20 LAB — HEMOGLOBIN A1C: Hgb A1c MFr Bld: 5.9 % (ref 4.6–6.5)

## 2019-11-20 NOTE — Progress Notes (Signed)
Pre visit review using our clinic review tool, if applicable. No additional management support is needed unless otherwise documented below in the visit note. 

## 2019-11-20 NOTE — Patient Instructions (Signed)
GO TO THE LAB : Get the blood work     [place] back for a checkup in 6 months, please make an appointment   Time check your blood pressure weekly BP GOAL is between 110/65 and  135/85. If it is consistently higher or lower, let me know    DASH Eating Plan DASH stands for "Dietary Approaches to Stop Hypertension." The DASH eating plan is a healthy eating plan that has been shown to reduce high blood pressure (hypertension). It may also reduce your risk for type 2 diabetes, heart disease, and stroke. The DASH eating plan may also help with weight loss. What are tips for following this plan?  General guidelines  Avoid eating more than 2,300 mg (milligrams) of salt (sodium) a day. If you have hypertension, you may need to reduce your sodium intake to 1,500 mg a day.  Limit alcohol intake to no more than 1 drink a day for nonpregnant women and 2 drinks a day for men. One drink equals 12 oz of beer, 5 oz of wine, or 1 oz of hard liquor.  Work with your health care provider to maintain a healthy body weight or to lose weight. Ask what an ideal weight is for you.  Get at least 30 minutes of exercise that causes your heart to beat faster (aerobic exercise) most days of the week. Activities may include walking, swimming, or biking.  Work with your health care provider or diet and nutrition specialist (dietitian) to adjust your eating plan to your individual calorie needs. Reading food labels   Check food labels for the amount of sodium per serving. Choose foods with less than 5 percent of the Daily Value of sodium. Generally, foods with less than 300 mg of sodium per serving fit into this eating plan.  To find whole grains, look for the word "whole" as the first word in the ingredient list. Shopping  Buy products labeled as "low-sodium" or "no salt added."  Buy fresh foods. Avoid canned foods and premade or frozen meals. Cooking  Avoid adding salt when cooking. Use  salt-free seasonings or herbs instead of table salt or sea salt. Check with your health care provider or pharmacist before using salt substitutes.  Do not fry foods. Cook foods using healthy methods such as baking, boiling, grilling, and broiling instead.  Cook with heart-healthy oils, such as olive, canola, soybean, or sunflower oil. Meal planning  Eat a balanced diet that includes: ? 5 or more servings of fruits and vegetables each day. At each meal, try to fill half of your plate with fruits and vegetables. ? Up to 6-8 servings of whole grains each day. ? Less than 6 oz of lean meat, poultry, or fish each day. A 3-oz serving of meat is about the same size as a deck of cards. One egg equals 1 oz. ? 2 servings of low-fat dairy each day. ? A serving of nuts, seeds, or beans 5 times each week. ? Heart-healthy fats. Healthy fats called Omega-3 fatty acids are found in foods such as flaxseeds and coldwater fish, like sardines, salmon, and mackerel.  Limit how much you eat of the following: ? Canned or prepackaged foods. ? Food that is high in trans fat, such as fried foods. ? Food that is high in saturated fat, such as fatty meat. ? Sweets, desserts, sugary drinks, and other foods with added sugar. ? Full-fat dairy products.  Do not salt foods before eating.  Try  to eat at least 2 vegetarian meals each week.  Eat more home-cooked food and less restaurant, buffet, and fast food.  When eating at a restaurant, ask that your food be prepared with less salt or no salt, if possible. What foods are recommended? The items listed may not be a complete list. Talk with your dietitian about what dietary choices are best for you. Grains Whole-grain or whole-wheat bread. Whole-grain or whole-wheat pasta. Brown rice. [place]. Bulgur. Whole-grain and low-sodium cereals. Pita bread. Low-fat, low-sodium crackers. Whole-wheat flour tortillas. Vegetables Fresh or frozen vegetables (raw, steamed,  roasted, or grilled). Low-sodium or reduced-sodium tomato and vegetable juice. Low-sodium or reduced-sodium tomato sauce and tomato paste. Low-sodium or reduced-sodium canned vegetables. Fruits All fresh, dried, or frozen fruit. Canned fruit in natural juice (without added sugar). Meat and other protein foods Skinless chicken or [place]. Ground chicken or [place]. Pork with fat trimmed off. Fish and seafood. Egg whites. Dried beans, peas, or lentils. Unsalted nuts, nut butters, and seeds. Unsalted canned beans. Lean cuts of beef with fat trimmed off. Low-sodium, lean deli meat. Dairy Low-fat (1%) or fat-free (skim) milk. Fat-free, low-fat, or reduced-fat cheeses. Nonfat, low-sodium ricotta or cottage cheese. Low-fat or nonfat yogurt. Low-fat, low-sodium cheese. Fats and oils Soft margarine without trans fats. Vegetable oil. Low-fat, reduced-fat, or light mayonnaise and salad dressings (reduced-sodium). Canola, safflower, olive, soybean, and sunflower oils. Avocado. Seasoning and other foods Herbs. Spices. Seasoning mixes without salt. Unsalted popcorn and pretzels. Fat-free sweets. What foods are not recommended? The items listed may not be a complete list. Talk with your dietitian about what dietary choices are best for you. Grains Baked goods made with fat, such as croissants, muffins, or some breads. Dry pasta or rice meal packs. Vegetables Creamed or fried vegetables. Vegetables in a cheese sauce. Regular canned vegetables (not low-sodium or reduced-sodium). Regular canned tomato sauce and paste (not low-sodium or reduced-sodium). Regular tomato and vegetable juice (not low-sodium or reduced-sodium). Angie Fava. Olives. Fruits Canned fruit in a light or heavy syrup. Fried fruit. Fruit in cream or butter sauce. Meat and other protein foods Fatty cuts of meat. Ribs. Fried meat. Berniece Salines. Sausage. Bologna and other processed lunch meats. Salami. Fatback. Hotdogs. Bratwurst. Salted nuts and seeds. Canned  beans with added salt. Canned or smoked fish. Whole eggs or egg yolks. Chicken or [place] with skin. Dairy Whole or 2% milk, cream, and half-and-half. Whole or full-fat cream cheese. Whole-fat or sweetened yogurt. Full-fat cheese. Nondairy creamers. Whipped toppings. Processed cheese and cheese spreads. Fats and oils Butter. Stick margarine. Lard. Shortening. Ghee. Bacon fat. Tropical oils, such as coconut, palm kernel, or palm oil. Seasoning and other foods Salted popcorn and pretzels. Onion salt, garlic salt, seasoned salt, table salt, and sea salt. Worcestershire sauce. Tartar sauce. Barbecue sauce. Teriyaki sauce. Soy sauce, including reduced-sodium. Steak sauce. Canned and packaged gravies. Fish sauce. Oyster sauce. Cocktail sauce. Horseradish that you find on the shelf. Ketchup. Mustard. Meat flavorings and tenderizers. Bouillon cubes. Hot sauce and Tabasco sauce. Premade or packaged marinades. Premade or packaged taco seasonings. Relishes. Regular salad dressings. Where to find more information:  National Heart, Lung, and [place]: https://wilson-eaton.com/  American Heart Association: www.heart.org Summary  The DASH eating plan is a healthy eating plan that has been shown to reduce high blood pressure (hypertension). It may also reduce your risk for type 2 diabetes, heart disease, and stroke.  With the DASH eating plan, you should limit salt (sodium) intake to 2,300 mg a day. If you  have hypertension, you may need to reduce your sodium intake to 1,500 mg a day.  When on the DASH eating plan, aim to eat more fresh fruits and vegetables, whole grains, lean proteins, low-fat dairy, and heart-healthy fats.  Work with your health care provider or diet and nutrition specialist (dietitian) to adjust your eating plan to your individual calorie needs. This information is not intended to replace advice given to you by your health care provider. Make sure you discuss any questions you have with your  health care provider. Document Revised: 08/16/2017 Document Reviewed: 08/27/2016 Elsevier Patient Education  2020 [employer].

## 2019-11-20 NOTE — Progress Notes (Signed)
   Subjective:    Patient ID: Jonathan Cohen, male    DOB: 08/30/1966, 54 y.o.   MRN: [id number]  DOS:  11/20/2019 Type of visit - description: CPX In general he has no concerns except that his insurance take blood work apparently showed high cholesterol.   Review of Systems  Other than above, a 14 point review of systems is negative    Past Medical History:  Diagnosis Date  . Back injury     Past Surgical History:  Procedure Laterality Date  . LIPOMA EXCISION N/A 11/2016   from posterior  neck     Allergies as of 11/20/2019   No Known Allergies     Medication List    as of November 20, 2019 11:59 PM   You have not been prescribed any medications.     Family History  Problem Relation Age of Onset  . Heart disease Father 30       MI  . Hyperlipidemia Mother   . Colon cancer Sister 52  . Diabetes Neg Hx   . Rectal cancer Neg Hx   . Prostate cancer Neg Hx       Objective:   Physical Exam BP (!) 135/95   Pulse 84   Temp (!) 97.2 F (36.2 C) (Temporal)   Resp 16   Ht 6\' 3"  (1.905 m)   Wt 202 lb 6 oz (91.8 kg)   SpO2 100%   BMI 25.30 kg/m      General: Well developed, NAD, BMI noted Neck: No  thyromegaly  HEENT:  Normocephalic . Face symmetric, atraumatic Lungs:  CTA B Normal respiratory effort, no intercostal retractions, no accessory muscle use. Heart: RRR,  no murmur.  Abdomen:  Not distended, soft, non-tender. No rebound or rigidity.   Lower extremities: no pretibial edema bilaterally DRE: Normal Skin: Exposed areas without rash. Not pale. Not jaundice Neurologic:  alert & oriented X3.  Speech normal, gait appropriate for age and unassisted Strength symmetric and appropriate for age.  Psych: Cognition and judgment appear intact.  Cooperative with normal attention span and concentration.  Behavior appropriate. No anxious or depressed appearing.  Assessment     ASSESSMENT Hyperglycemia: A1c 5.9 (10-2015) Sebaceous cyst, neck  Plan: Here  for CPX Hyperglycemia: Checking A1c Slightly elevated BP: Recheck manually, 135/95.  Recommend to start checking blood rate BPs, watch salt intake.  Increase physical activity.  Recheck in 6 months. History of COVID-19 pneumonia: He feels 100% recuperated RTC 6 months   This visit occurred during the SARS-CoV-2 public health emergency.  Safety protocols were in place, including screening questions prior to the visit, additional usage of staff PPE, and extensive cleaning of exam room while observing appropriate contact time as indicated for disinfecting solutions.

## 2019-11-22 NOTE — Assessment & Plan Note (Signed)
-  Td 2020 -Plans to get Covid shot when available through his job -CCS: Cscope 2019, next 12/2022 per GI letter -prostate ca screening DRE normal, no symptoms, check a PSA -Labs: CMP, FLP, A1c, PSA -Diet exercise: Room for improvement, counseled.

## 2019-11-22 NOTE — Assessment & Plan Note (Signed)
Here for CPX Hyperglycemia: Checking A1c Slightly elevated BP: Recheck manually, 135/95.  Recommend to start checking blood rate BPs, watch salt intake.  Increase physical activity.  Recheck in 6 months. History of COVID-19 pneumonia: He feels 100% recuperated RTC 6 months

## 2020-05-27 ENCOUNTER — Ambulatory Visit: Payer: 59 | Admitting: Internal Medicine

## 2020-05-27 ENCOUNTER — Encounter: Payer: Self-pay | Admitting: Internal Medicine

## 2020-05-27 ENCOUNTER — Other Ambulatory Visit: Payer: Self-pay

## 2020-05-27 DIAGNOSIS — I10 Essential (primary) hypertension: Secondary | ICD-10-CM

## 2020-05-27 MED ORDER — AMLODIPINE BESYLATE 5 MG PO TABS: 5.0000 mg | ORAL_TABLET | Freq: Every day | ORAL | 1 refills | Status: DC

## 2020-05-27 NOTE — Progress Notes (Signed)
   Subjective:    Patient ID: Jonathan Cohen, male    DOB: [birth year], 54 y.o.   MRN: [id number]  DOS:  05/27/2020 Type of visit - description: Follow-up Here for BP follow-up No recent ambulatory BPs as recommended. He is doing well with diet except for low salt (room for improvement)   Review of Systems Denies chest pain no difficulty breathing.  No headache  Past Medical History:  Diagnosis Date  . Back injury     Past Surgical History:  Procedure Laterality Date  . LIPOMA EXCISION N/A 11/2016   from posterior  neck     Allergies as of 05/27/2020   No Known Allergies     Medication List    as of May 27, 2020  1:08 PM   You have not been prescribed any medications.        Objective:   Physical Exam BP (!) 144/100 (BP Location: Left Arm, Patient Position: Sitting, Cuff Size: Normal)   Pulse 70   Temp 97.8 F (36.6 C) (Oral)   Resp 16   Ht 6\' 3"  (1.905 m)   Wt 201 lb 4 oz (91.3 kg)   SpO2 100%   BMI 25.15 kg/m  General:   Well developed, NAD, BMI noted. HEENT:  Normocephalic . Face symmetric, atraumatic Lungs:  CTA B Normal respiratory effort, no intercostal retractions, no accessory muscle use. Heart: RRR,  no murmur.  Lower extremities: no pretibial edema bilaterally  Skin: Not pale. Not jaundice Neurologic:  alert & oriented X3.  Speech normal, gait appropriate for age and unassisted Psych--  Cognition and judgment appear intact.  Cooperative with normal attention span and concentration.  Behavior appropriate. No anxious or depressed appearing.      Assessment    ASSESSMENT Hyperglycemia: A1c 5.9 (10-2015) HTN dx 05/2020 Sebaceous cyst, neck  PLAN HTN: BP was elevated the last time he was here, he is doing well with diet exercise (could improve low salt intake). No amb readings but today BP is again elevated, I rechecked manually: 135/100. Plan: Try to eat less salt, continue good physical activity, start amlodipine 5 mg daily,  check BPs at home, if unable to come back to the office to be checked. Otherwise RTC 6 months CPX.   This visit occurred during the SARS-CoV-2 public health emergency.  Safety protocols were in place, including screening questions prior to the visit, additional usage of staff PPE, and extensive cleaning of exam room while observing appropriate contact time as indicated for disinfecting solutions.

## 2020-05-27 NOTE — Patient Instructions (Signed)
Start taking amlodipine 5 mg 1 tablet at bedtime  Check the  blood pressure regularly BP GOAL is between 110/65 and  135/85. If it is consistently higher or lower, let me know  Watch your salt intake.    GO TO THE FRONT DESK, PLEASE SCHEDULE YOUR APPOINTMENTS Come back for a physical exam in 6 months  If you are unable to check your blood pressures at home, please call and make an appointment for a nurse visit, we can check it here      [place] stands for "Dietary Approaches to Stop Hypertension." The DASH eating plan is a healthy eating plan that has been shown to reduce high blood pressure (hypertension). It may also reduce your risk for type 2 diabetes, heart disease, and stroke. The DASH eating plan may also help with weight loss. What are tips for following this plan?  General guidelines  Avoid eating more than 2,300 mg (milligrams) of salt (sodium) a day. If you have hypertension, you may need to reduce your sodium intake to 1,500 mg a day.  Limit alcohol intake to no more than 1 drink a day for nonpregnant women and 2 drinks a day for men. One drink equals 12 oz of beer, 5 oz of wine, or 1 oz of hard liquor.  Work with your health care provider to maintain a healthy body weight or to lose weight. Ask what an ideal weight is for you.  Get at least 30 minutes of exercise that causes your heart to beat faster (aerobic exercise) most days of the week. Activities may include walking, swimming, or biking.  Work with your health care provider or diet and nutrition specialist (dietitian) to adjust your eating plan to your individual calorie needs. Reading food labels   Check food labels for the amount of sodium per serving. Choose foods with less than 5 percent of the Daily Value of sodium. Generally, foods with less than 300 mg of sodium per serving fit into this eating plan.  To find whole grains, look for the word "whole" as the first word in the ingredient  list. Shopping  Buy products labeled as "low-sodium" or "no salt added."  Buy fresh foods. Avoid canned foods and premade or frozen meals. Cooking  Avoid adding salt when cooking. Use salt-free seasonings or herbs instead of table salt or sea salt. Check with your health care provider or pharmacist before using salt substitutes.  Do not fry foods. Cook foods using healthy methods such as baking, boiling, grilling, and broiling instead.  Cook with heart-healthy oils, such as olive, canola, soybean, or sunflower oil. Meal planning  Eat a balanced diet that includes: ? 5 or more servings of fruits and vegetables each day. At each meal, try to fill half of your plate with fruits and vegetables. ? Up to 6-8 servings of whole grains each day. ? Less than 6 oz of lean meat, poultry, or fish each day. A 3-oz serving of meat is about the same size as a deck of cards. One egg equals 1 oz. ? 2 servings of low-fat dairy each day. ? A serving of nuts, seeds, or beans 5 times each week. ? Heart-healthy fats. Healthy fats called Omega-3 fatty acids are found in foods such as flaxseeds and coldwater fish, like sardines, salmon, and mackerel.  Limit how much you eat of the following: ? Canned or prepackaged foods. ? Food that is high in trans fat, such as fried foods. ? Food that  is high in saturated fat, such as fatty meat. ? Sweets, desserts, sugary drinks, and other foods with added sugar. ? Full-fat dairy products.  Do not salt foods before eating.  Try to eat at least 2 vegetarian meals each week.  Eat more home-cooked food and less restaurant, buffet, and fast food.  When eating at a restaurant, ask that your food be prepared with less salt or no salt, if possible. What foods are recommended? The items listed may not be a complete list. Talk with your dietitian about what dietary choices are best for you. Grains Whole-grain or whole-wheat bread. Whole-grain or whole-wheat pasta. Brown  rice. Oatmeal. Quinoa. Bulgur. Whole-grain and low-sodium cereals. Pita bread. Low-fat, low-sodium crackers. Whole-wheat flour tortillas. Vegetables Fresh or frozen vegetables (raw, steamed, roasted, or grilled). Low-sodium or reduced-sodium tomato and vegetable juice. Low-sodium or reduced-sodium tomato sauce and tomato paste. Low-sodium or reduced-sodium canned vegetables. Fruits All fresh, dried, or frozen fruit. Canned fruit in natural juice (without added sugar). Meat and other protein foods Skinless chicken or turkey. Ground chicken or turkey. Pork with fat trimmed off. Fish and seafood. Egg whites. Dried beans, peas, or lentils. Unsalted nuts, nut butters, and seeds. Unsalted canned beans. Lean cuts of beef with fat trimmed off. Low-sodium, lean deli meat. Dairy Low-fat (1%) or fat-free (skim) milk. Fat-free, low-fat, or reduced-fat cheeses. Nonfat, low-sodium ricotta or cottage cheese. Low-fat or nonfat yogurt. Low-fat, low-sodium cheese. Fats and oils Soft margarine without trans fats. Vegetable oil. Low-fat, reduced-fat, or light mayonnaise and salad dressings (reduced-sodium). Canola, safflower, olive, soybean, and sunflower oils. Avocado. Seasoning and other foods Herbs. Spices. Seasoning mixes without salt. Unsalted popcorn and pretzels. Fat-free sweets. What foods are not recommended? The items listed may not be a complete list. Talk with your dietitian about what dietary choices are best for you. Grains Baked goods made with fat, such as croissants, muffins, or some breads. Dry pasta or rice meal packs. Vegetables Creamed or fried vegetables. Vegetables in a cheese sauce. Regular canned vegetables (not low-sodium or reduced-sodium). Regular canned tomato sauce and paste (not low-sodium or reduced-sodium). Regular tomato and vegetable juice (not low-sodium or reduced-sodium). Pickles. Olives. Fruits Canned fruit in a light or heavy syrup. Fried fruit. Fruit in cream or butter  sauce. Meat and other protein foods Fatty cuts of meat. Ribs. Fried meat. Bacon. Sausage. Bologna and other processed lunch meats. Salami. Fatback. Hotdogs. Bratwurst. Salted nuts and seeds. Canned beans with added salt. Canned or smoked fish. Whole eggs or egg yolks. Chicken or turkey with skin. Dairy Whole or 2% milk, cream, and half-and-half. Whole or full-fat cream cheese. Whole-fat or sweetened yogurt. Full-fat cheese. Nondairy creamers. Whipped toppings. Processed cheese and cheese spreads. Fats and oils Butter. Stick margarine. Lard. Shortening. Ghee. Bacon fat. Tropical oils, such as coconut, palm kernel, or palm oil. Seasoning and other foods Salted popcorn and pretzels. Onion salt, garlic salt, seasoned salt, table salt, and sea salt. Worcestershire sauce. Tartar sauce. Barbecue sauce. Teriyaki sauce. Soy sauce, including reduced-sodium. Steak sauce. Canned and packaged gravies. Fish sauce. Oyster sauce. Cocktail sauce. Horseradish that you find on the shelf. Ketchup. Mustard. Meat flavorings and tenderizers. Bouillon cubes. Hot sauce and Tabasco sauce. Premade or packaged marinades. Premade or packaged taco seasonings. Relishes. Regular salad dressings. Where to find more information:  National Heart, Lung, and Blood Institute: www.nhlbi.nih.gov  American Heart Association: www.heart.org Summary  The DASH eating plan is a healthy eating plan that has been shown to reduce high blood pressure (hypertension).   It may also reduce your risk for type 2 diabetes, heart disease, and stroke.  With the DASH eating plan, you should limit salt (sodium) intake to 2,300 mg a day. If you have hypertension, you may need to reduce your sodium intake to 1,500 mg a day.  When on the DASH eating plan, aim to eat more fresh fruits and vegetables, whole grains, lean proteins, low-fat dairy, and heart-healthy fats.  Work with your health care provider or diet and nutrition specialist (dietitian) to adjust  your eating plan to your individual calorie needs. This information is not intended to replace advice given to you by your health care provider. Make sure you discuss any questions you have with your health care provider. Document Revised: 08/16/2017 Document Reviewed: 08/27/2016 Elsevier Patient Education  2020 [employer].

## 2020-05-27 NOTE — Progress Notes (Signed)
Pre visit review using our clinic review tool, if applicable. No additional management support is needed unless otherwise documented below in the visit note. 

## 2020-05-29 DIAGNOSIS — I10 Essential (primary) hypertension: Secondary | ICD-10-CM | POA: Insufficient documentation

## 2020-05-29 NOTE — Assessment & Plan Note (Signed)
HTN: BP was elevated the last time he was here, he is doing well with diet exercise (could improve low salt intake). No amb readings but today BP is again elevated, I rechecked manually: 135/100. Plan: Try to eat less salt, continue good physical activity, start amlodipine 5 mg daily, check BPs at home, if unable to come back to the office to be checked. Otherwise RTC 6 months CPX.

## 2020-07-21 ENCOUNTER — Encounter: Payer: Self-pay | Admitting: Internal Medicine

## 2020-11-19 ENCOUNTER — Other Ambulatory Visit: Payer: Self-pay | Admitting: Internal Medicine

## 2020-11-25 ENCOUNTER — Ambulatory Visit (INDEPENDENT_AMBULATORY_CARE_PROVIDER_SITE_OTHER): Payer: 59 | Admitting: Internal Medicine

## 2020-11-25 ENCOUNTER — Encounter: Payer: Self-pay | Admitting: Internal Medicine

## 2020-11-25 ENCOUNTER — Other Ambulatory Visit: Payer: Self-pay

## 2020-11-25 VITALS — BP 127/84 | HR 76 | Temp 97.7°F | Ht 75.0 in | Wt 201.0 lb

## 2020-11-25 DIAGNOSIS — I10 Essential (primary) hypertension: Secondary | ICD-10-CM

## 2020-11-25 DIAGNOSIS — Z0000 Encounter for general adult medical examination without abnormal findings: Secondary | ICD-10-CM

## 2020-11-25 DIAGNOSIS — Z1159 Encounter for screening for other viral diseases: Secondary | ICD-10-CM | POA: Diagnosis not present

## 2020-11-25 DIAGNOSIS — R739 Hyperglycemia, unspecified: Secondary | ICD-10-CM | POA: Diagnosis not present

## 2020-11-25 NOTE — Progress Notes (Signed)
   Subjective:    Patient ID: Jonathan Cohen, male    DOB: 1966/07/21, 55 y.o.   MRN: [id number]  DOS:  11/25/2020 Type of visit - description: CPX Since the last office visit he is doing very well.  Has no concerns.  Review of Systems  A 14 point review of systems is negative     Past Medical History:  Diagnosis Date  . Back injury     Past Surgical History:  Procedure Laterality Date  . LIPOMA EXCISION N/A 11/2016   from posterior  neck     Allergies as of 11/25/2020   No Known Allergies     Medication List       Accurate as of November 25, 2020 11:59 PM. If you have any questions, ask your nurse or doctor.        amLODipine 5 MG tablet Commonly known as: NORVASC Take 1 tablet (5 mg total) by mouth daily.          Objective:   Physical Exam BP 127/84 (BP Location: Right Arm, Patient Position: Sitting, Cuff Size: Large)   Pulse 76   Temp 97.7 F (36.5 C) (Oral)   Ht 6\' 3"  (1.905 m)   Wt 201 lb (91.2 kg)   SpO2 100%   BMI 25.12 kg/m  General: Well developed, NAD, BMI noted Neck: No  thyromegaly  HEENT:  Normocephalic . Face symmetric, atraumatic Lungs:  CTA B Normal respiratory effort, no intercostal retractions, no accessory muscle use. Heart: RRR,  no murmur.  Abdomen:  Not distended, soft, non-tender. No rebound or rigidity.   Lower extremities: no pretibial edema bilaterally  Skin: Exposed areas without rash. Not pale. Not jaundice Neurologic:  alert & oriented X3.  Speech normal, gait appropriate for age and unassisted Strength symmetric and appropriate for age.  Psych: Cognition and judgment appear intact.  Cooperative with normal attention span and concentration.  Behavior appropriate. No anxious or depressed appearing.     Assessment     ASSESSMENT Hyperglycemia: A1c 5.9 (10-2015) HTN dx 05/2020 Sebaceous cyst, neck  PLAN Here for CPX Hyperglycemia: Check A1c HTN: On amlodipine, check BPs regularly but when he does has normal  readings.  Encouraged to check monthly, watch salt intake. RTC 1 year  This visit occurred during the SARS-CoV-2 public health emergency.  Safety protocols were in place, including screening questions prior to the visit, additional usage of staff PPE, and extensive cleaning of exam room while observing appropriate contact time as indicated for disinfecting solutions.

## 2020-11-25 NOTE — Patient Instructions (Addendum)
Check your blood pressure monthly BP GOAL is between 110/65 and  135/85. If it is consistently higher or lower, let me know    GO TO THE LAB : Get the blood work     [place], [place] back for   a physical exam in 1 year      Advance Directive  Advance directives are legal documents that allow you to make decisions about your health care and medical treatment in case you become unable to communicate for yourself. Advance directives let your wishes be known to family, friends, and health care providers. Discussing and writing advance directives should happen over time rather than all at once. Advance directives can be changed and updated at any time. There are different types of advance directives, such as:  Medical power of attorney.  Living will.  Do not resuscitate (DNR) order or do not attempt resuscitation (DNAR) order. Health care proxy and medical power of attorney A health care proxy is also called a health care agent. This person is appointed to make medical decisions for you when you are unable to make decisions for yourself. Generally, people ask a trusted friend or family member to act as their proxy and represent their preferences. Make sure you have an agreement with your trusted person to act as your proxy. A proxy may have to make a medical decision on your behalf if your wishes are not known. A medical power of attorney, also called a durable power of attorney for health care, is a legal document that names your health care proxy. Depending on the laws in your state, the document may need to be:  Signed.  Notarized.  Dated.  Copied.  Witnessed.  Incorporated into your medical record. You may also want to appoint a trusted person to manage your money in the event you are unable to do so. This is called a durable power of attorney for finances. It is a separate legal document from the durable power of attorney for health  care. You may choose your health care proxy or someone different to act as your agent in money matters. If you do not appoint a proxy, or there is a concern that the proxy is not acting in your best interest, a court may appoint a guardian to act on your behalf. Living will A living will is a set of instructions that state your wishes about medical care when you cannot express them yourself. Health care providers should keep a copy of your living will in your medical record. You may want to give a copy to family members or friends. To alert caregivers in case of an emergency, you can place a card in your wallet to let them know that you have a living will and where they can find it. A living will is used if you become:  Terminally ill.  Disabled.  Unable to communicate or make decisions. The following decisions should be included in your living will:  To use or not to use life support equipment, such as dialysis machines and breathing machines (ventilators).  Whether you want a DNR or DNAR order. This tells health care providers not to use cardiopulmonary resuscitation (CPR) if breathing or heartbeat stops.  To use or not to use tube feeding.  To be given or not to be given food and fluids.  Whether you want comfort (palliative) care when the goal becomes comfort rather than a cure.  Whether you want  to donate your organs and tissues. A living will does not give instructions for distributing your money and property if you should pass away. DNR or DNAR A DNR or DNAR order is a request not to have CPR in the event that your heart stops beating or you stop breathing. If a DNR or DNAR order has not been made and shared, a health care provider will try to help any patient whose heart has stopped or who has stopped breathing. If you plan to have surgery, talk with your health care provider about how your DNR or DNAR order will be followed if problems occur. What if I do not have an advance  directive? Some states assign family decision makers to act on your behalf if you do not have an advance directive. Each state has its own laws about advance directives. You may want to check with your health care provider, attorney, or state representative about the laws in your state. Summary  Advance directives are legal documents that allow you to make decisions about your health care and medical treatment in case you become unable to communicate for yourself.  The process of discussing and writing advance directives should happen over time. You can change and update advance directives at any time.  Advance directives may include a medical power of attorney, a living will, and a DNR or DNAR order. This information is not intended to replace advice given to you by your health care provider. Make sure you discuss any questions you have with your health care provider. Document Revised: 06/07/2020 Document Reviewed: 06/07/2020 Elsevier Patient Education  2021 [employer].

## 2020-11-27 ENCOUNTER — Encounter: Payer: Self-pay | Admitting: Internal Medicine

## 2020-11-27 NOTE — Assessment & Plan Note (Signed)
-  Td2020 -COVID vaccine x3 -Had a flu shot   -[facility]:RVACQP 2019, next 12/2022 per GI letter -prostate ca screening DRE and PSA normal last year.   -Labs: CMP, FLP, CBC, A1c, hep C -Diet exercise: Doing great, watching his diet, exercising regularly.

## 2020-11-27 NOTE — Assessment & Plan Note (Signed)
Here for CPX Hyperglycemia: Check A1c HTN: On amlodipine, check BPs regularly but when he does has normal readings.  Encouraged to check monthly, watch salt intake. RTC 1 year

## 2020-11-28 LAB — HEMOGLOBIN A1C
Hgb A1c MFr Bld: 5.7 % of total Hgb — ABNORMAL HIGH (ref <5.7)
Mean Plasma Glucose: 117 mg/dL
eAG (mmol/L): 6.5 mmol/L

## 2020-11-28 LAB — COMPREHENSIVE METABOLIC PANEL
AG Ratio: 1.7 (calc) (ref 1.0–2.5)
ALT: 16 U/L (ref 9–46)
AST: 19 U/L (ref 10–35)
Albumin: 4.5 g/dL (ref 3.6–5.1)
Alkaline phosphatase (APISO): 73 U/L (ref 35–144)
BUN: 17 mg/dL (ref 7–25)
CO2: 27 mmol/L (ref 20–32)
Calcium: 9.6 mg/dL (ref 8.6–10.3)
Chloride: 101 mmol/L (ref 98–110)
Creat: 1.11 mg/dL (ref 0.70–1.33)
Globulin: 2.6 g/dL (calc) (ref 1.9–3.7)
Glucose, Bld: 91 mg/dL (ref 65–99)
Potassium: 4.2 mmol/L (ref 3.5–5.3)
Sodium: 138 mmol/L (ref 135–146)
Total Bilirubin: 0.9 mg/dL (ref 0.2–1.2)
Total Protein: 7.1 g/dL (ref 6.1–8.1)

## 2020-11-28 LAB — CBC WITH DIFFERENTIAL/PLATELET
Absolute Monocytes: 707 cells/uL (ref 200–950)
Basophils Absolute: 30 cells/uL (ref 0–200)
Basophils Relative: 0.4 %
Eosinophils Absolute: 53 cells/uL (ref 15–500)
Eosinophils Relative: 0.7 %
HCT: 47.7 % (ref 38.5–50.0)
Hemoglobin: 16.6 g/dL (ref 13.2–17.1)
Lymphs Abs: 3359 cells/uL (ref 850–3900)
MCH: 29.7 pg (ref 27.0–33.0)
MCHC: 34.8 g/dL (ref 32.0–36.0)
MCV: 85.5 fL (ref 80.0–100.0)
MPV: 9.3 fL (ref 7.5–12.5)
Monocytes Relative: 9.3 %
Neutro Abs: 3450 cells/uL (ref 1500–7800)
Neutrophils Relative %: 45.4 %
Platelets: 287 10*3/uL (ref 140–400)
RBC: 5.58 10*6/uL (ref 4.20–5.80)
RDW: 14.7 % (ref 11.0–15.0)
Total Lymphocyte: 44.2 %
WBC: 7.6 10*3/uL (ref 3.8–10.8)

## 2020-11-28 LAB — LIPID PANEL
Cholesterol: 218 mg/dL — ABNORMAL HIGH (ref <200)
HDL: 37 mg/dL — ABNORMAL LOW (ref >=40)
LDL Cholesterol (Calc): 159 mg/dL (calc) — ABNORMAL HIGH
Non-HDL Cholesterol (Calc): 181 mg/dL (calc) — ABNORMAL HIGH (ref <130)
Total CHOL/HDL Ratio: 5.9 (calc) — ABNORMAL HIGH (ref <5.0)
Triglycerides: 102 mg/dL (ref <150)

## 2020-11-28 LAB — HEPATITIS C ANTIBODY
Hepatitis C Ab: NONREACTIVE
SIGNAL TO CUT-OFF: 0 (ref <1.00)

## 2021-08-29 ENCOUNTER — Other Ambulatory Visit: Payer: Self-pay

## 2021-08-29 ENCOUNTER — Ambulatory Visit
Admission: EM | Admit: 2021-08-29 | Discharge: 2021-08-29 | Disposition: A | Discharge status: Home / Self Care | Payer: 59 | Attending: Internal Medicine | Admitting: Internal Medicine

## 2021-08-29 DIAGNOSIS — U071 COVID-19: Secondary | ICD-10-CM

## 2021-08-29 MED ORDER — FLUTICASONE PROPIONATE 50 MCG/ACT NA SUSP: 1.0000 | Freq: Every day | NASAL | 0 refills | Status: DC

## 2021-08-29 MED ORDER — CETIRIZINE HCL 10 MG PO TABS: 10.0000 mg | ORAL_TABLET | Freq: Every day | ORAL | 0 refills | Status: DC

## 2021-08-29 NOTE — ED Triage Notes (Signed)
Pt c/o headache and runny nose since Friday. States had a positive home covid test yesterday.

## 2021-08-29 NOTE — ED Provider Notes (Signed)
[facility]    CSN: [id number] Arrival date & time: 08/29/21  1101      History   Chief Complaint Chief Complaint  Patient presents with   Covid Positive   Headache    HPI Jonathan Cohen is a 55 y.o. male.   Patient presents after testing positive for COVID-19 with an at home test yesterday.  He reports that he has had a 5-day history of headache and runny nose.  Denies cough, nasal congestion, sore throat, ear pain, chest pain, shortness of breath, nausea, vomiting, diarrhea, abdominal pain.  Denies any known fevers or sick contacts.  Patient has taken Alka-Seltzer plus with some improvement in symptoms.   Headache  Past Medical History:  Diagnosis Date   Back injury     Patient Active Problem List   Diagnosis Date Noted   Hypertension 05/29/2020   PCP NOTES >>>>>>>>>>>>>>>>>>>>>>>>>>>>>. 10/31/2015   Annual physical exam 09/13/2011    Past Surgical History:  Procedure Laterality Date   LIPOMA EXCISION N/A 11/2016   from posterior  neck        Home Medications    Prior to Admission medications   Medication Sig Start Date End Date Taking? Authorizing Provider  cetirizine (ZYRTEC) 10 MG tablet Take 1 tablet (10 mg total) by mouth daily for 10 days. 08/29/21 09/08/21 Yes Rodolfo Gaster, Michele Rockers, FNP  fluticasone (FLONASE) 50 MCG/ACT nasal spray Place 1 spray into both nostrils daily for 3 days. 08/29/21 09/01/21 Yes Surabhi Gadea, Michele Rockers, FNP  amLODipine (NORVASC) 5 MG tablet Take 1 tablet (5 mg total) by mouth daily. 11/21/20   Colon Branch, MD    Family History Family History  Problem Relation Age of Onset   Heart disease Father 56       MI   Hyperlipidemia Mother    Colon cancer Sister 27   Diabetes Neg Hx    Rectal cancer Neg Hx    Prostate cancer Neg Hx     Social History Social History   Tobacco Use   Smoking status: Never   Smokeless tobacco: Never  Vaping Use   Vaping Use: Never used  Substance Use Topics   Alcohol use: No   Drug use: No      Allergies   Patient has no known allergies.   Review of Systems Review of Systems Per HPI  Physical Exam Triage Vital Signs ED Triage Vitals  Enc Vitals Group     BP 08/29/21 1226 (!) 146/109     Pulse Rate 08/29/21 1226 81     Resp 08/29/21 1226 18     Temp 08/29/21 1226 98.1 F (36.7 C)     Temp Source 08/29/21 1226 Oral     SpO2 08/29/21 1226 98 %     Weight --      Height --      Head Circumference --      Peak Flow --      Pain Score 08/29/21 1227 3     Pain Loc --      Pain Edu? --      Excl. in [place]? --    No data found.  Updated Vital Signs BP (!) 146/109 (BP Location: Left Arm)    Pulse 81    Temp 98.1 F (36.7 C) (Oral)    Resp 18    SpO2 98%   Visual Acuity Right Eye Distance:   Left Eye Distance:   Bilateral Distance:    Right Eye Near:  Left Eye Near:    Bilateral Near:     Physical Exam Constitutional:      General: He is not in acute distress.    Appearance: Normal appearance. He is not toxic-appearing or diaphoretic.  HENT:     Head: Normocephalic and atraumatic.     Right Ear: Ear canal normal. A middle ear effusion is present. Tympanic membrane is not perforated, erythematous or bulging.     Left Ear: Ear canal normal. A middle ear effusion is present. Tympanic membrane is not perforated, erythematous or bulging.     Nose: Congestion present.     Mouth/Throat:     Mouth: Mucous membranes are moist.     Pharynx: No posterior oropharyngeal erythema.  Eyes:     Extraocular Movements: Extraocular movements intact.     Conjunctiva/sclera: Conjunctivae normal.     Pupils: Pupils are equal, round, and reactive to light.  Cardiovascular:     Rate and Rhythm: Normal rate and regular rhythm.     Pulses: Normal pulses.     Heart sounds: Normal heart sounds.  Pulmonary:     Effort: Pulmonary effort is normal. No respiratory distress.     Breath sounds: Normal breath sounds. No stridor. No wheezing, rhonchi or rales.  Abdominal:      General: Abdomen is flat. Bowel sounds are normal.     Palpations: Abdomen is soft.  Musculoskeletal:        General: Normal range of motion.     Cervical back: Normal range of motion.  Skin:    General: Skin is warm and dry.  Neurological:     General: No focal deficit present.     Mental Status: He is alert and oriented to person, place, and time. Mental status is at baseline.  Psychiatric:        Mood and Affect: Mood normal.        Behavior: Behavior normal.     UC Treatments / Results  Labs (all labs ordered are listed, but only abnormal results are displayed) Labs Reviewed - No data to display  EKG   Radiology No results found.  Procedures Procedures (including critical care time)  Medications Ordered in UC Medications - No data to display  Initial Impression / Assessment and Plan / UC Course  I have reviewed the triage vital signs and the nursing notes.  Pertinent labs & imaging results that were available during my care of the patient were reviewed by me and considered in my medical decision making (see chart for details).     Discussed symptomatic treatment for COVID-19 with patient.  Discussed antiviral treatment with patient but patient declined with shared decision making.  Cetirizine and Flonase offered for patient.  Patient has elevated blood pressure reading in urgent care today but reports he has not been taking his amlodipine.  Patient to restart amlodipine at prescribed dose.  Monitor blood pressures at home.  Follow-up with PCP if it remains elevated.  Discussed strict return precautions.  Patient verbalized understanding and was agreeable with plan. Final Clinical Impressions(s) / UC Diagnoses   Final diagnoses:  [id number]     Discharge Instructions      COVID is a virus that is treated by symptomatic treatment.  You have been prescribed 2 medications that should help alleviate some of your symptoms.  Please follow-up with primary care if symptoms  persist.    ED Prescriptions     Medication Sig Dispense Auth. Provider   cetirizine (ZYRTEC) 10  MG tablet Take 1 tablet (10 mg total) by mouth daily for 10 days. 10 tablet [place], [place] E, [place]   fluticasone [facility]) 50 MCG/ACT nasal spray Place 1 spray into both nostrils daily for 3 days. 16 g Teodora Medici, [place]      PDMP not reviewed this encounter.   Teodora Medici, [place] 08/29/21 1239

## 2021-08-29 NOTE — Discharge Instructions (Signed)
COVID is a virus that is treated by symptomatic treatment.  You have been prescribed 2 medications that should help alleviate some of your symptoms.  Please follow-up with primary care if symptoms persist.

## 2021-08-30 IMAGING — CR DG CHEST 2V
2 series · 2 of 2 positions shown · non-contrast
Comparison: None.

CLINICAL DATA: 53-year-old male with fever body ache and back pain
for 5 days.

EXAM:
CHEST - 2 VIEW

[w chest pa]
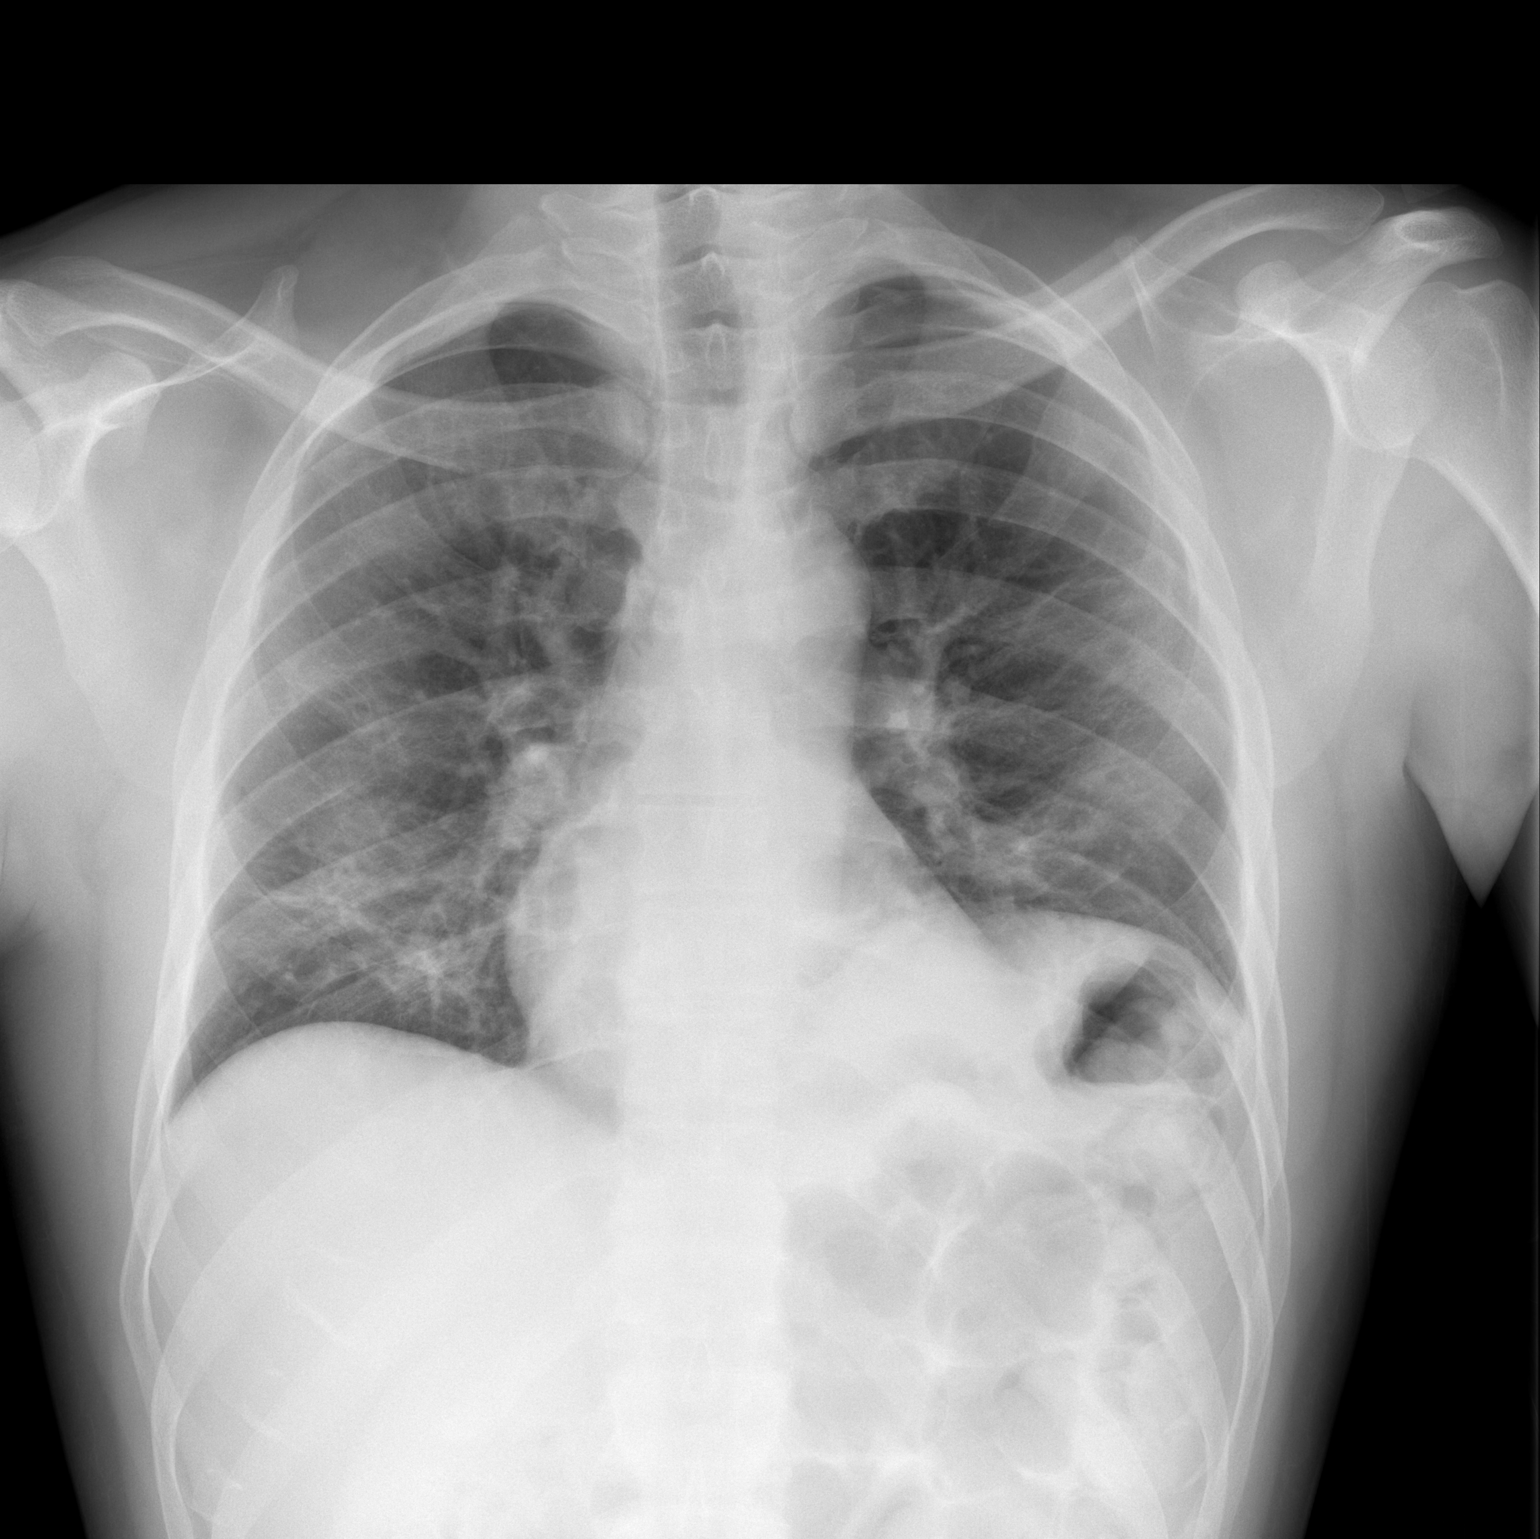

[w chest lat]
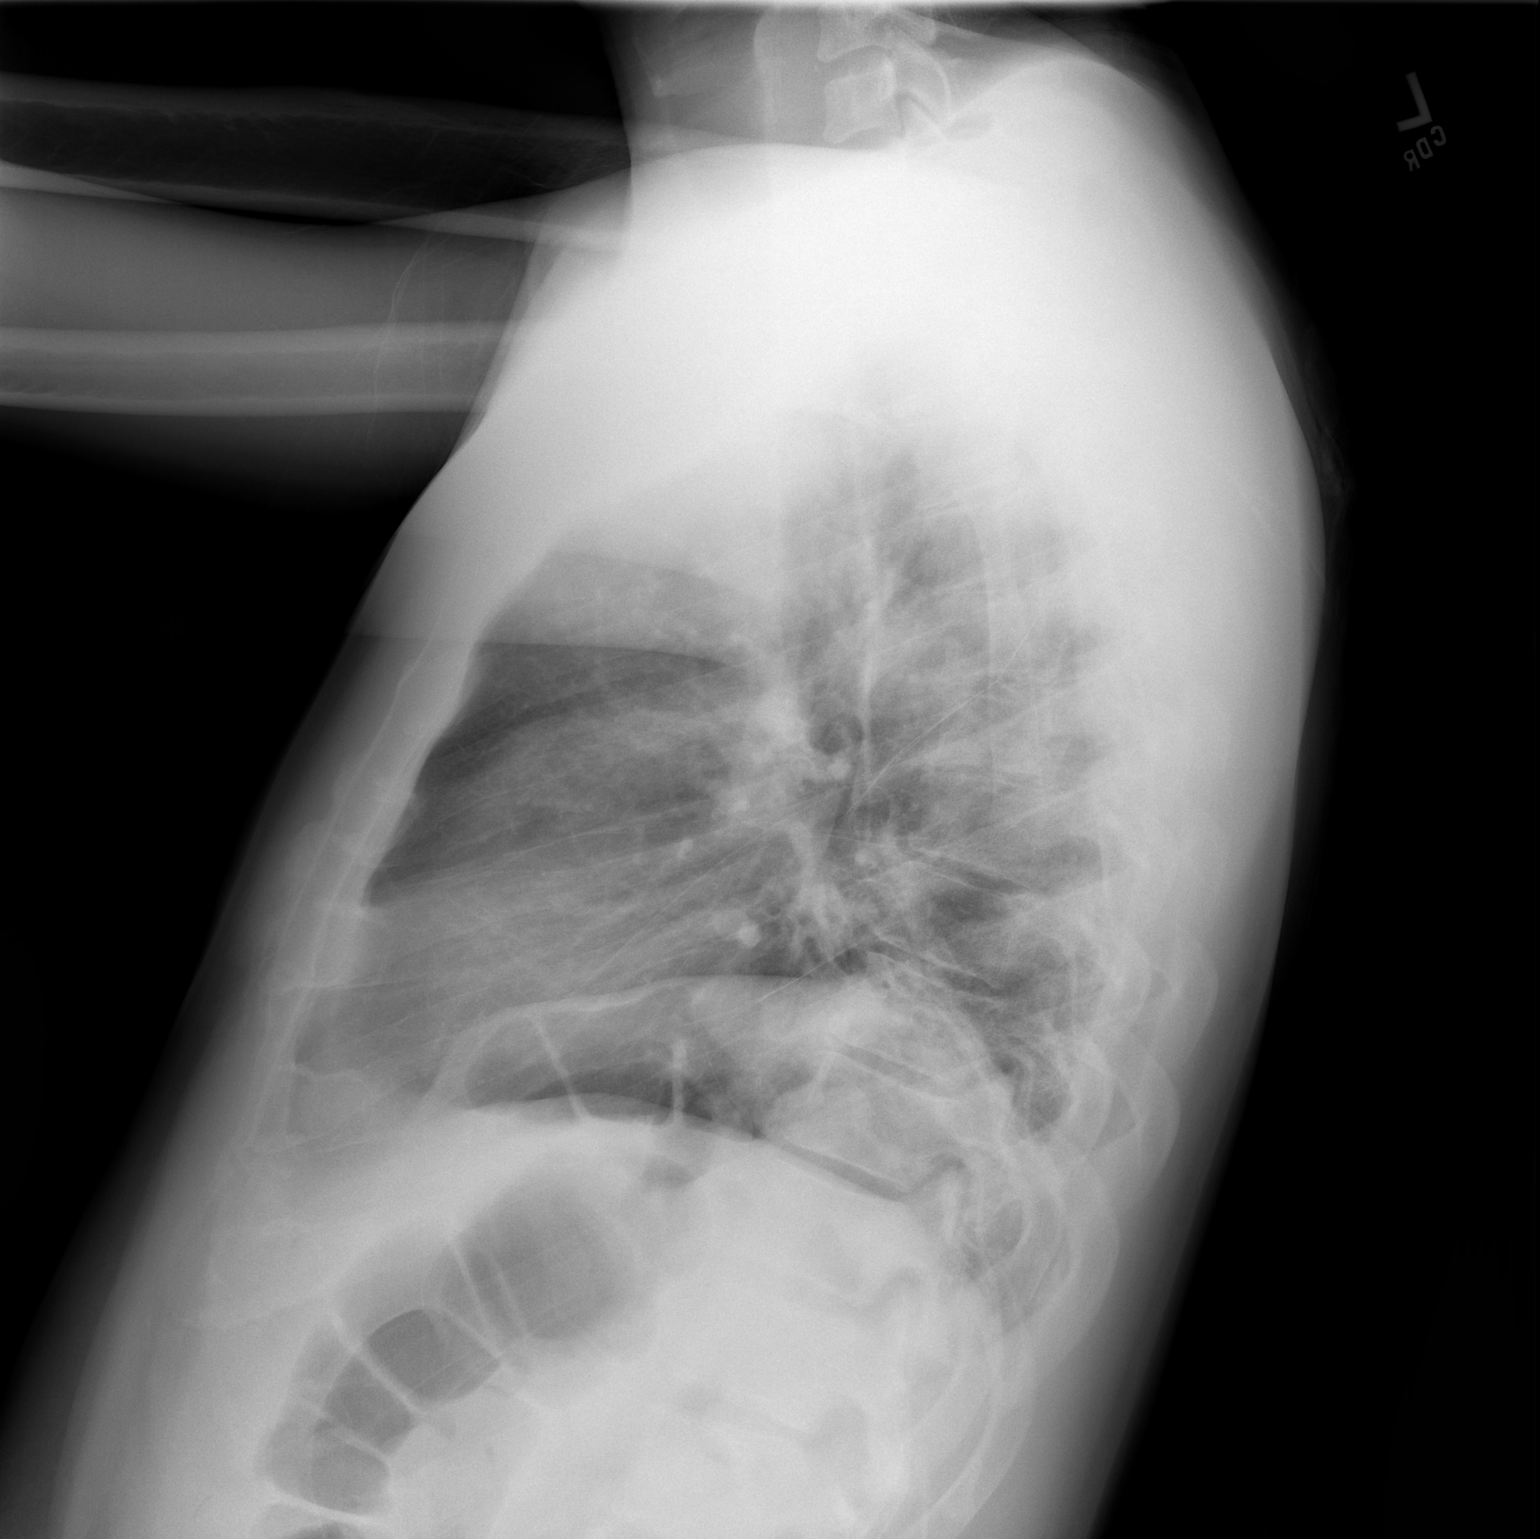

[2 of 2 positions shown; findings below may reference images not displayed]

FINDINGS: Normal cardiac size and mediastinal contours. Visualized tracheal
air column is within normal limits. Somewhat low lung volumes.
Patchy bilateral abnormal pulmonary opacity. Elevated left
hemidiaphragm with associated more confluent left lung base opacity.
But no evidence of pleural effusion. No pulmonary edema or
pneumothorax.

Negative visible bowel gas pattern. No acute osseous abnormality
identified.
IMPRESSION: Patchy and indistinct bilateral pulmonary opacity, possibly with
superimposed left lung base consolidation. But no pleural effusion.
Favor multifocal pneumonia, and consider acute viral/atypical
etiology.

## 2021-08-30 IMAGING — CT CT ABD-PELV W/ CM
2 of 5 series · 16 of 46 positions shown, 18 images · IV contrast (APPLIED)
Comparison: None.

CLINICAL DATA: Abdominal pain with fever

EXAM:
CT ABDOMEN AND PELVIS WITH CONTRAST
TECHNIQUE: Multidetector CT imaging of the abdomen and pelvis was performed
using the standard protocol following bolus administration of
intravenous contrast.
CONTRAST:  100mL OMNIPAQUE IOHEXOL 300 MG/ML  SOLN

[Series 2: axial st · axial · 0.76mm/px · z∈[-213,+222]mm · 13 of 99 slices shown, 15 images]
[im 6/99  soft-tissue]
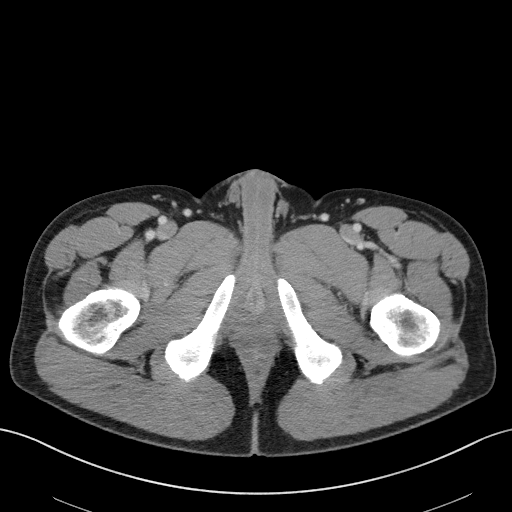
[im 6/99  bone]
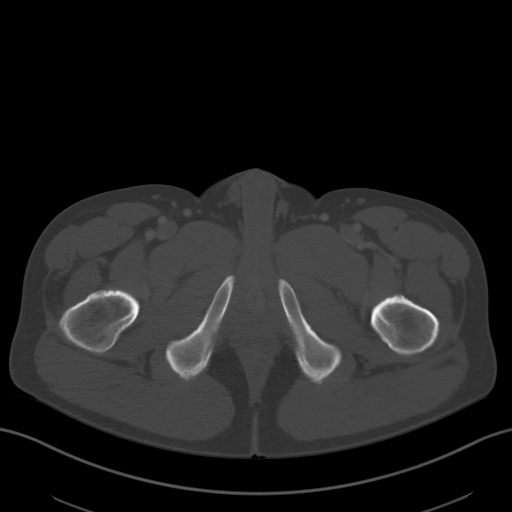
[im 11/99  soft-tissue]
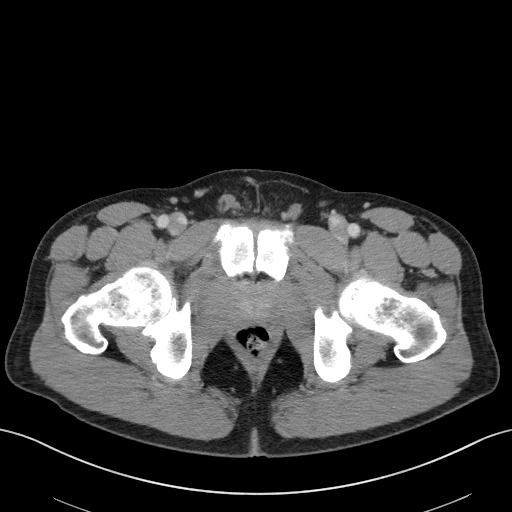
[im 22/99  soft-tissue]
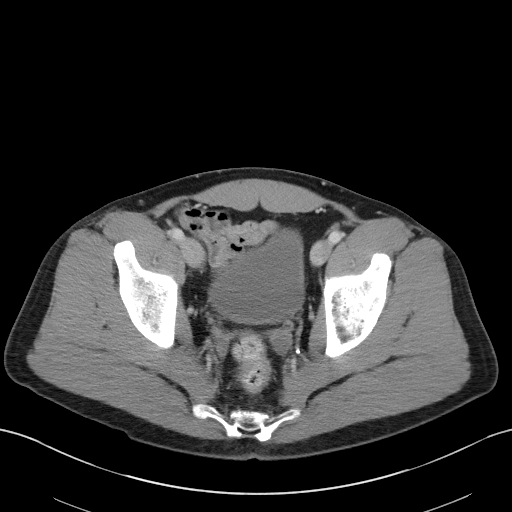
[im 28/99  soft-tissue]
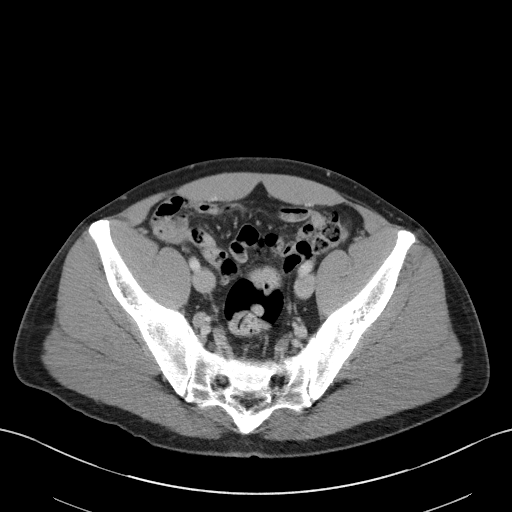
[im 33/99  soft-tissue]
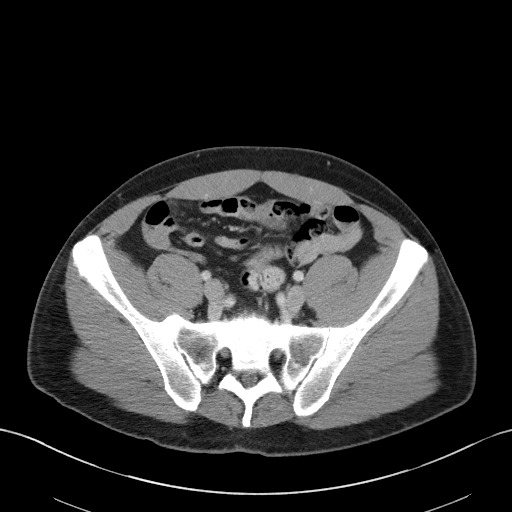
[im 44/99  soft-tissue]
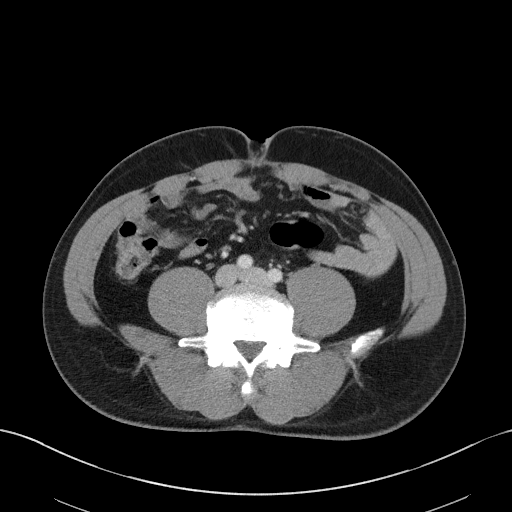
[im 50/99  soft-tissue]
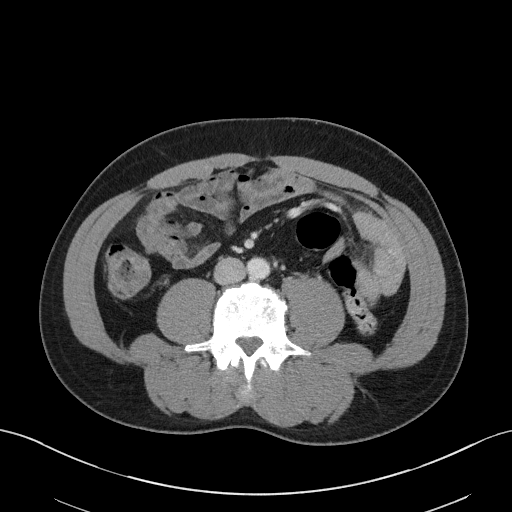
[im 55/99  soft-tissue]
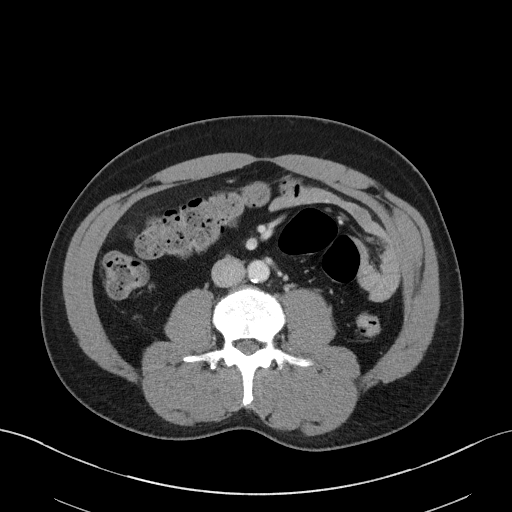
[im 66/99  soft-tissue]
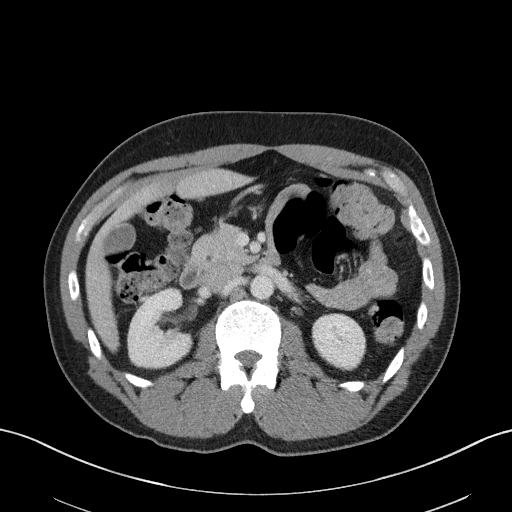
[im 66/99  bone]
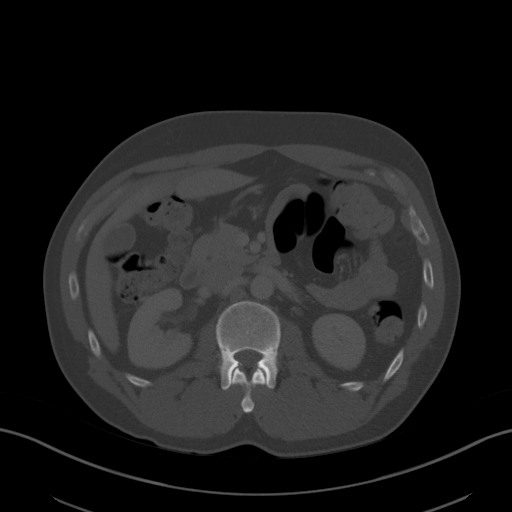
[im 71/99  soft-tissue]
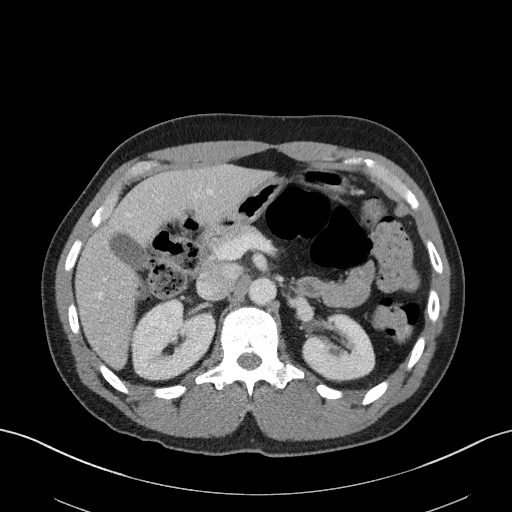
[im 77/99  soft-tissue]
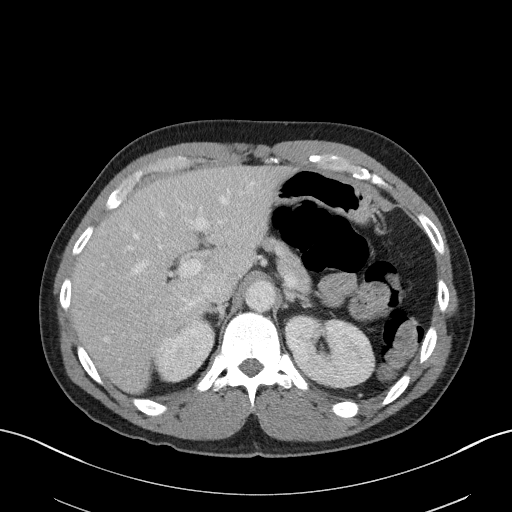
[im 88/99  soft-tissue]
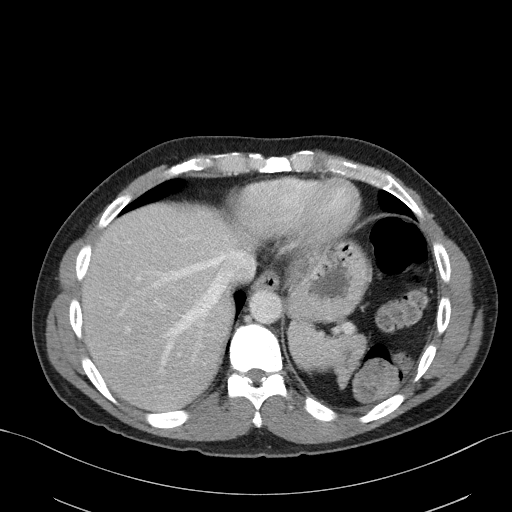
[im 93/99  soft-tissue]
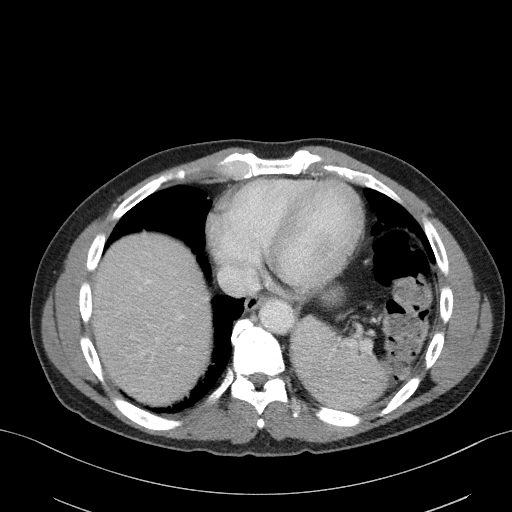

[Series 5: coronal st · coronal · 0.74mm/px · 3 of 83 slices shown]
[im 28/83  soft-tissue]
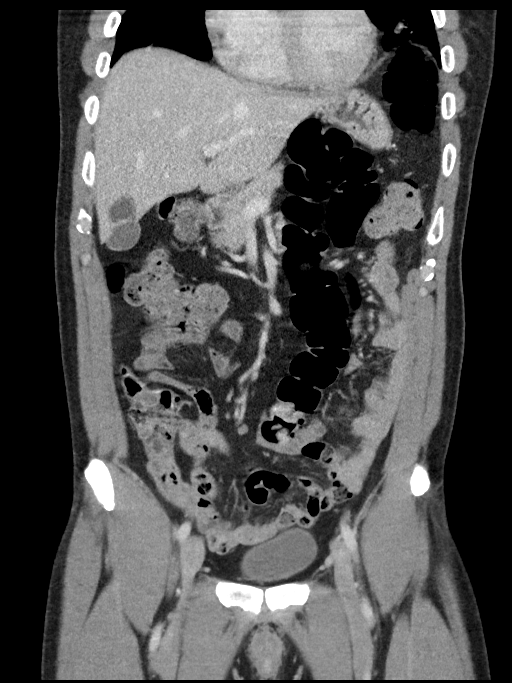
[im 37/83  soft-tissue]
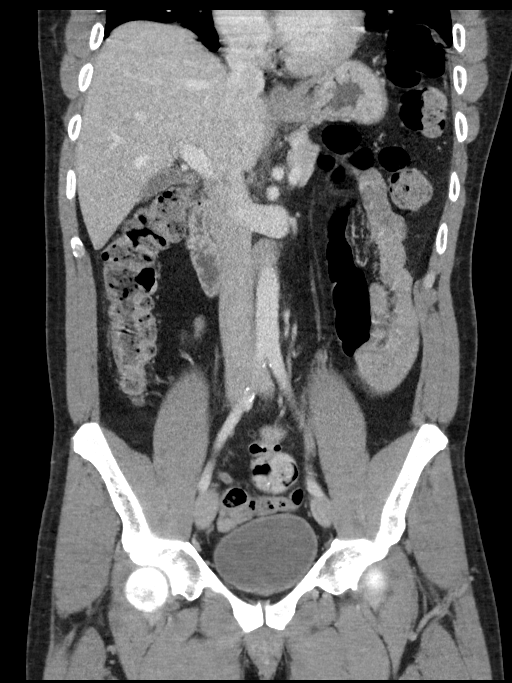
[im 46/83  soft-tissue]
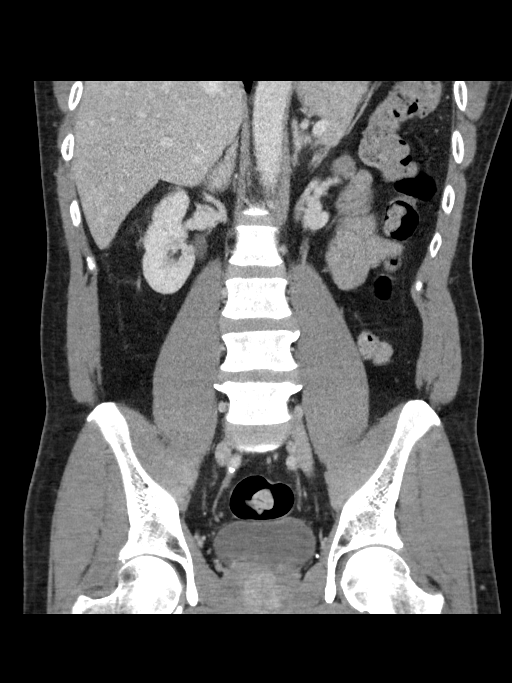

[16 of 46 positions shown; findings below may reference images not displayed]

FINDINGS: Lower chest: Lung bases demonstrate patchy consolidations and
ground-glass densities within the bilateral lower lobes, lingula,
and right middle lobe. No pleural effusion. Normal heart size. Small
amount of gynecomastia

Hepatobiliary: No focal liver abnormality is seen. No gallstones,
gallbladder wall thickening, or biliary dilatation.

Pancreas: Unremarkable. No pancreatic ductal dilatation or
surrounding inflammatory changes.

Spleen: Normal in size without focal abnormality.

Adrenals/Urinary Tract: Adrenal glands are unremarkable. Kidneys are
normal, without renal calculi, focal lesion, or hydronephrosis.
Bladder is unremarkable.

Stomach/Bowel: Stomach is within normal limits. Appendix appears
normal. No evidence of bowel wall thickening, distention, or
inflammatory changes.

Vascular/Lymphatic: Mild aortic atherosclerosis. No aneurysm. No
significantly enlarged lymph nodes.

Reproductive: Enlarged prostate with mass effect on the bladder

Other: Negative for free air or free fluid

Musculoskeletal: No acute or significant osseous findings.
IMPRESSION: 1. Patchy consolidations and ground-glass densities within the
bilateral lower lobes, lingula and right middle lobe suspicious for
pneumonia. Slightly subpleural distribution of ground-glass density
on the right raises concern for atypical or viral pneumonia.
2. Prostatomegaly
3. Otherwise no CT evidence for acute intra-abdominal or pelvic
abnormality.

## 2021-10-20 ENCOUNTER — Encounter: Payer: Self-pay | Admitting: Internal Medicine

## 2021-10-23 ENCOUNTER — Ambulatory Visit: Payer: 59 | Admitting: Family Medicine

## 2021-10-23 ENCOUNTER — Encounter: Payer: Self-pay | Admitting: Family Medicine

## 2021-10-23 VITALS — BP 127/79 | HR 74 | Temp 97.6°F | Ht 75.0 in | Wt 201.8 lb

## 2021-10-23 DIAGNOSIS — J0140 Acute pansinusitis, unspecified: Secondary | ICD-10-CM

## 2021-10-23 MED ORDER — DOXYCYCLINE HYCLATE 100 MG PO TABS: 100.0000 mg | ORAL_TABLET | Freq: Two times a day (BID) | ORAL | 0 refills | Status: AC

## 2021-10-23 NOTE — Progress Notes (Signed)
Acute Office Visit  Subjective:    Patient ID: Jonathan Cohen, male    DOB: Oct 05, 1965, 56 y.o.   MRN: [id number]  Chief Complaint  Patient presents with   Sinusitis    Sinusitis  Patient is in today for sinusitis symptoms.   Patient states that he has had over 2 weeks of generalized sinus pressure, headaches, nasal congestion, "non stop mucus," rhinorrhea, post nasal drip, mild ear pressure, occasional cough with the drainage. He denies any nausea, vomiting, diarrhea, dyspnea, chest pain, fatigue, fevers, body aches. He has gotten some improvement with OTC medications, but symptoms continue to linger.     Past Medical History:  Diagnosis Date   Back injury     Past Surgical History:  Procedure Laterality Date   LIPOMA EXCISION N/A 11/2016   from posterior  neck     Family History  Problem Relation Age of Onset   Heart disease Father 28       MI   Hyperlipidemia Mother    Colon cancer Sister 31   Diabetes Neg Hx    Rectal cancer Neg Hx    Prostate cancer Neg Hx     Social History   Socioeconomic History   Marital status: Married    Spouse name: Not on file   Number of children: 3   Years of education: Not on file   Highest education level: Not on file  Occupational History   Occupation: IT sales professional: [employer]  Tobacco Use   Smoking status: Never   Smokeless tobacco: Never  Vaping Use   Vaping Use: Never used  Substance and Sexual Activity   Alcohol use: No   Drug use: No   Sexual activity: Not on file  Other Topics Concern   Not on file  Social History Narrative   C[employer] fan!   Household-- pt, wife and 3 children   3 children-- 1999;  2001; 2006   Social Determinants of Radio broadcast assistant Strain: Not on [employer] Insecurity: Not on file  Transportation Needs: Not on file  Physical Activity: Not on file  Stress: Not on file  Social Connections: Not on file  Intimate Partner Violence: Not on file    Outpatient  Medications Prior to Visit  Medication Sig Dispense Refill   amLODipine (NORVASC) 5 MG tablet Take 1 tablet (5 mg total) by mouth daily. 90 tablet 1   cetirizine (ZYRTEC) 10 MG tablet Take 1 tablet (10 mg total) by mouth daily for 10 days. 10 tablet 0   fluticasone (FLONASE) 50 MCG/ACT nasal spray Place 1 spray into both nostrils daily for 3 days. 16 g 0   No facility-administered medications prior to visit.    No Known Allergies  Review of Systems All review of systems negative except what is listed in the HPI     Objective:    Physical Exam Vitals reviewed.  Constitutional:      Appearance: Normal appearance.  HENT:     Head: Normocephalic and atraumatic.     Right Ear: Tympanic membrane normal.     Left Ear: Tympanic membrane normal.     Mouth/Throat:     Mouth: Mucous membranes are moist.     Pharynx: Oropharynx is clear. No oropharyngeal exudate or posterior oropharyngeal erythema.  Eyes:     Extraocular Movements: Extraocular movements intact.     Conjunctiva/sclera: Conjunctivae normal.  Cardiovascular:     Rate and Rhythm: Normal rate and regular  rhythm.  Pulmonary:     Effort: Pulmonary effort is normal.     Breath sounds: Normal breath sounds.  Musculoskeletal:     Cervical back: Normal range of motion and neck supple. No tenderness.  Lymphadenopathy:     Cervical: No cervical adenopathy.  Skin:    General: Skin is warm and dry.  Neurological:     General: No focal deficit present.     Mental Status: He is alert and oriented to person, place, and time. Mental status is at baseline.  Psychiatric:        Mood and Affect: Mood normal.        Behavior: Behavior normal.        Thought Content: Thought content normal.        Judgment: Judgment normal.    BP 127/79    Pulse 74    Temp 97.6 F (36.4 C)    Ht 6\' 3"  (1.905 m)    Wt 201 lb 12.8 oz (91.5 kg)    SpO2 100%    BMI 25.22 kg/m  Wt Readings from Last 3 Encounters:  10/23/21 201 lb 12.8 oz (91.5 kg)   11/25/20 201 lb (91.2 kg)  05/27/20 201 lb 4 oz (91.3 kg)    Health Maintenance Due  Topic Date Due   INFLUENZA VACCINE  04/17/2021   COVID-19 Vaccine (5 - Booster for Moderna series) 05/01/2021   Zoster Vaccines- Shingrix (2 of 2) 05/01/2021    There are no preventive care reminders to display for this patient.   Lab Results  Component Value Date   TSH 1.47 11/06/2017   Lab Results  Component Value Date   WBC 7.6 11/25/2020   HGB 16.6 11/25/2020   HCT 47.7 11/25/2020   MCV 85.5 11/25/2020   PLT 287 11/25/2020   Lab Results  Component Value Date   NA 138 11/25/2020   K 4.2 11/25/2020   CO2 27 11/25/2020   GLUCOSE 91 11/25/2020   BUN 17 11/25/2020   CREATININE 1.11 11/25/2020   BILITOT 0.9 11/25/2020   ALKPHOS 77 11/20/2019   AST 19 11/25/2020   ALT 16 11/25/2020   PROT 7.1 11/25/2020   ALBUMIN 4.2 11/20/2019   CALCIUM 9.6 11/25/2020   ANIONGAP 11 05/31/2019   GFR 85.72 11/20/2019   Lab Results  Component Value Date   CHOL 218 (H) 11/25/2020   Lab Results  Component Value Date   HDL 37 (L) 11/25/2020   Lab Results  Component Value Date   LDLCALC 159 (H) 11/25/2020   Lab Results  Component Value Date   TRIG 102 11/25/2020   Lab Results  Component Value Date   CHOLHDL 5.9 (H) 11/25/2020   Lab Results  Component Value Date   HGBA1C 5.7 (H) 11/25/2020       Assessment & Plan:    1. Acute non-recurrent pansinusitis Starting antibiotics for sinus infection Continue supportive measures including rest, hydration, humidifier use, steam showers, warm compresses to sinuses, warm liquids with lemon and honey, and over-the-counter cough, cold, and analgesics as needed.    - doxycycline (VIBRA-TABS) 100 MG tablet; Take 1 tablet (100 mg total) by mouth 2 (two) times daily for 7 days.  Dispense: 14 tablet; Refill: 0   Follow-up if symptoms worsen or fail to improve.   Terrilyn Saver, NP

## 2021-10-23 NOTE — Patient Instructions (Signed)
Starting antibiotics for sinus infection Continue supportive measures including rest, hydration, humidifier use, steam showers, warm compresses to sinuses, warm liquids with lemon and honey, and over-the-counter cough, cold, and analgesics as needed.    Please contact office for follow-up if symptoms do not improve or worsen. Seek emergency care if symptoms become severe.

## 2021-10-28 ENCOUNTER — Other Ambulatory Visit: Payer: Self-pay | Admitting: Internal Medicine

## 2021-12-01 ENCOUNTER — Encounter: Payer: 59 | Admitting: Internal Medicine

## 2021-12-15 ENCOUNTER — Encounter: Payer: Self-pay | Admitting: Internal Medicine

## 2021-12-15 ENCOUNTER — Ambulatory Visit (INDEPENDENT_AMBULATORY_CARE_PROVIDER_SITE_OTHER): Payer: 59 | Admitting: Internal Medicine

## 2021-12-15 VITALS — BP 130/70 | HR 54 | Resp 20 | Ht 75.0 in | Wt 205.0 lb

## 2021-12-15 DIAGNOSIS — Z125 Encounter for screening for malignant neoplasm of prostate: Secondary | ICD-10-CM

## 2021-12-15 DIAGNOSIS — R739 Hyperglycemia, unspecified: Secondary | ICD-10-CM | POA: Diagnosis not present

## 2021-12-15 DIAGNOSIS — I10 Essential (primary) hypertension: Secondary | ICD-10-CM

## 2021-12-15 DIAGNOSIS — Z0000 Encounter for general adult medical examination without abnormal findings: Secondary | ICD-10-CM | POA: Diagnosis not present

## 2021-12-15 NOTE — Patient Instructions (Signed)
Check the  blood pressure regularly, twice a month ?BP GOAL is between 110/65 and  135/85. ?If it is consistently higher or lower, let me know ? ?  ? ?GO TO THE LAB : Get the blood work   ? ? ?[place], [place] ?Come back for a physical exam in 1 year ? ? ? ? ?DASH Eating Plan ?DASH stands for Dietary Approaches to Stop Hypertension. The DASH eating plan is a healthy eating plan that has been shown to: ?Reduce high blood pressure (hypertension). ?Reduce your risk for type 2 diabetes, heart disease, and stroke. ?Help with weight loss. ?What are tips for following this plan? ?Reading food labels ?Check food labels for the amount of salt (sodium) per serving. Choose foods with less than 5 percent of the Daily Value of sodium. Generally, foods with less than 300 milligrams (mg) of sodium per serving fit into this eating plan. ?To find whole grains, look for the word "whole" as the first word in the ingredient list. ?Shopping ?Buy products labeled as "low-sodium" or "no salt added." ?Buy fresh foods. Avoid canned foods and pre-made or frozen meals. ?Cooking ?Avoid adding salt when cooking. Use salt-free seasonings or herbs instead of table salt or sea salt. Check with your health care provider or pharmacist before using salt substitutes. ?Do not fry foods. Cook foods using healthy methods such as baking, boiling, grilling, roasting, and broiling instead. ?Cook with heart-healthy oils, such as olive, canola, avocado, soybean, or sunflower oil. ?Meal planning ? ?Eat a balanced diet that includes: ?4 or more servings of fruits and 4 or more servings of vegetables each day. Try to fill one-half of your plate with fruits and vegetables. ?6-8 servings of whole grains each day. ?Less than 6 oz (170 g) of lean meat, poultry, or fish each day. A 3-oz (85-g) serving of meat is about the same size as a deck of cards. One egg equals 1 oz (28 g). ?2-3 servings of low-fat dairy each day. One  serving is 1 cup (237 mL). ?1 serving of nuts, seeds, or beans 5 times each week. ?2-3 servings of heart-healthy fats. Healthy fats called omega-3 fatty acids are found in foods such as walnuts, flaxseeds, fortified milks, and eggs. These fats are also found in cold-water fish, such as sardines, salmon, and mackerel. ?Limit how much you eat of: ?Canned or prepackaged foods. ?Food that is high in trans fat, such as some fried foods. ?Food that is high in saturated fat, such as fatty meat. ?Desserts and other sweets, sugary drinks, and other foods with added sugar. ?Full-fat dairy products. ?Do not salt foods before eating. ?Do not eat more than 4 egg yolks a week. ?Try to eat at least 2 vegetarian meals a week. ?Eat more home-cooked food and less restaurant, buffet, and fast food. ?Lifestyle ?When eating at a restaurant, ask that your food be prepared with less salt or no salt, if possible. ?If you drink alcohol: ?Limit how much you use to: ?0-1 drink a day for women who are not pregnant. ?0-2 drinks a day for men. ?Be aware of how much alcohol is in your drink. In the [place] one drink equals one 12 oz bottle of beer (355 mL), one 5 oz glass of wine (148 mL), or one 1? oz glass of hard liquor (44 mL). ?General information ?Avoid eating more than 2,300 mg of salt a day. If you have hypertension, you may need to reduce your sodium intake  to 1,500 mg a day. ?Work with your health care provider to maintain a healthy body weight or to lose weight. Ask what an ideal weight is for you. ?Get at least 30 minutes of exercise that causes your heart to beat faster (aerobic exercise) most days of the week. Activities may include walking, swimming, or biking. ?Work with your health care provider or dietitian to adjust your eating plan to your individual calorie needs. ?What foods should I eat? ?Fruits ?All fresh, dried, or frozen fruit. Canned fruit in natural juice (without added sugar). ?Vegetables ?Fresh or frozen vegetables  (raw, steamed, roasted, or grilled). Low-sodium or reduced-sodium tomato and vegetable juice. Low-sodium or reduced-sodium tomato sauce and tomato paste. Low-sodium or reduced-sodium canned vegetables. ?Grains ?Whole-grain or whole-wheat bread. Whole-grain or whole-wheat pasta. Brown rice. Modena Morrow. Bulgur. Whole-grain and low-sodium cereals. Pita bread. Low-fat, low-sodium crackers. Whole-wheat flour tortillas. ?Meats and other proteins ?Skinless chicken or [place]. Ground chicken or [place]. Pork with fat trimmed off. Fish and seafood. Egg whites. Dried beans, peas, or lentils. Unsalted nuts, nut butters, and seeds. Unsalted canned beans. Lean cuts of beef with fat trimmed off. Low-sodium, lean precooked or cured meat, such as sausages or meat loaves. ?Dairy ?Low-fat (1%) or fat-free (skim) milk. Reduced-fat, low-fat, or fat-free cheeses. Nonfat, low-sodium ricotta or cottage cheese. Low-fat or nonfat yogurt. Low-fat, low-sodium cheese. ?Fats and oils ?Soft margarine without trans fats. Vegetable oil. Reduced-fat, low-fat, or light mayonnaise and salad dressings (reduced-sodium). Canola, safflower, olive, avocado, soybean, and sunflower oils. Avocado. ?Seasonings and condiments ?Herbs. Spices. Seasoning mixes without salt. ?Other foods ?Unsalted popcorn and pretzels. Fat-free sweets. ?The items listed above may not be a complete list of foods and beverages you can eat. Contact a dietitian for more information. ?What foods should I avoid? ?Fruits ?Canned fruit in a light or heavy syrup. Fried fruit. Fruit in cream or butter sauce. ?Vegetables ?Creamed or fried vegetables. Vegetables in a cheese sauce. Regular canned vegetables (not low-sodium or reduced-sodium). Regular canned tomato sauce and paste (not low-sodium or reduced-sodium). Regular tomato and vegetable juice (not low-sodium or reduced-sodium). Angie Fava. Olives. ?Grains ?Baked goods made with fat, such as croissants, muffins, or some breads. Dry pasta  or rice meal packs. ?Meats and other proteins ?Fatty cuts of meat. Ribs. Fried meat. Berniece Salines. Bologna, salami, and other precooked or cured meats, such as sausages or meat loaves. Fat from the back of a pig (fatback). Bratwurst. Salted nuts and seeds. Canned beans with added salt. Canned or smoked fish. Whole eggs or egg yolks. Chicken or [place] with skin. ?Dairy ?Whole or 2% milk, cream, and half-and-half. Whole or full-fat cream cheese. Whole-fat or sweetened yogurt. Full-fat cheese. Nondairy creamers. Whipped toppings. Processed cheese and cheese spreads. ?Fats and oils ?Butter. Stick margarine. Lard. Shortening. Ghee. Bacon fat. Tropical oils, such as coconut, palm kernel, or palm oil. ?Seasonings and condiments ?Onion salt, garlic salt, seasoned salt, table salt, and sea salt. Worcestershire sauce. Tartar sauce. Barbecue sauce. Teriyaki sauce. Soy sauce, including reduced-sodium. Steak sauce. Canned and packaged gravies. Fish sauce. Oyster sauce. Cocktail sauce. Store-bought horseradish. Ketchup. Mustard. Meat flavorings and tenderizers. Bouillon cubes. Hot sauces. Pre-made or packaged marinades. Pre-made or packaged taco seasonings. Relishes. Regular salad dressings. ?Other foods ?Salted popcorn and pretzels. ?The items listed above may not be a complete list of foods and beverages you should avoid. Contact a dietitian for more information. ?Where to find more information ?National Heart, Lung, and Blood Institute: https://wilson-eaton.com/ ?American Heart Association: www.heart.org ?Academy of Nutrition and  Dietetics: www.eatright.org ?[place]: www.kidney.org ?Summary ?The DASH eating plan is a healthy eating plan that has been shown to reduce high blood pressure (hypertension). It may also reduce your risk for type 2 diabetes, heart disease, and stroke. ?When on the DASH eating plan, aim to eat more fresh fruits and vegetables, whole grains, lean proteins, low-fat dairy, and heart-healthy  fats. ?With the DASH eating plan, you should limit salt (sodium) intake to 2,300 mg a day. If you have hypertension, you may need to reduce your sodium intake to 1,500 mg a day. ?Work with your health care provider or

## 2021-12-15 NOTE — Progress Notes (Signed)
? ?Subjective:  ? ? Patient ID: Jonathan Cohen, male    DOB: 04/27/1966, 56 y.o.   MRN: [id number] ? ?DOS:  12/15/2021 ?Type of visit - description: cpx ? ?Here for CPX. ?In general feels well. ?He weaned off BP meds several months ago.  No recent ambulatory BPs ? ?BP Readings from Last 3 Encounters:  ?12/15/21 130/70  ?10/23/21 127/79  ?08/29/21 137/90  ? ? ? ?Review of Systems ? ? ?A 14 point review of systems is negative  ? ? ?Past Medical History:  ?Diagnosis Date  ? Back injury   ? ? ?Past Surgical History:  ?Procedure Laterality Date  ? LIPOMA EXCISION N/A 11/2016  ? from posterior  neck   ? ? ?Social History  ? ?Socioeconomic History  ? Marital status: Married  ?  Spouse name: Not on file  ? Number of children: 3  ? Years of education: Not on file  ? Highest education level: Not on file  ?Occupational History  ? Occupation: managment  ?  Employer: [employer]  ?Tobacco Use  ? Smoking status: Never  ? Smokeless tobacco: Never  ? Tobacco comments:  ?  Never used  ?Vaping Use  ? Vaping Use: Never used  ?Substance and Sexual Activity  ? Alcohol use: No  ? Drug use: No  ? Sexual activity: Yes  ?  Birth control/protection: None  ?Other Topics Concern  ? Not on file  ?Social History Narrative  ? [employer] fan!  ? Household-- pt, wife and 3 children  ? 3 children-- 1999;  2001; 2006  ? ?Social Determinants of Health  ? ?Financial Resource Strain: Not on file  ?Food Insecurity: Not on file  ?Transportation Needs: Not on file  ?Physical Activity: Not on file  ?Stress: Not on file  ?Social Connections: Not on file  ?Intimate Partner Violence: Not on file  ? ? ?No current outpatient medications ? ? ?   ?Objective:  ? Physical Exam ?BP 130/70 (BP Location: Left Arm, Patient Position: Sitting, Cuff Size: Normal)   Pulse (!) 54   Resp 20   Ht '6\' 3"'$  (1.905 m)   Wt 205 lb (93 kg)   SpO2 98%   BMI 25.62 kg/m?  ?General: ?Well developed, NAD, BMI noted ?Neck: No  thyromegaly  ?HEENT:  ?Normocephalic . Face symmetric,  atraumatic ?Lungs:  ?CTA B ?Normal respiratory effort, no intercostal retractions, no accessory muscle use. ?Heart: RRR,  no murmur.  ?Abdomen:  ?Not distended, soft, non-tender. No rebound or rigidity.   ?Lower extremities: no pretibial edema bilaterally ?DRE: Normal sphincter tone, no stools, prostate normal ?Skin: Exposed areas without rash. Not pale. Not jaundice ?Neurologic:  ?alert & oriented X3.  ?Speech normal, gait appropriate for age and unassisted ?Strength symmetric and appropriate for age.  ?Psych: ?Cognition and judgment appear intact.  ?Cooperative with normal attention span and concentration.  ?Behavior appropriate. ?No anxious or depressed appearing. ? ?   ?Assessment   ? ? ?ASSESSMENT ?Hyperglycemia: A1c 5.9 (10-2015) ?HTN dx 05/2020 ?Sebaceous cyst, neck ?DX COVID-19 08/2021. ? ? ?PLAN ?Here for CPX ?Hyperglycemia: Check A1c ?HTN: Self DC amlodipine several months ago, no recent ambulatory BPs, doing better with diet, trying to exercise more.  BP today is great.  Recommend to continue his healthy lifestyle and check ambulatory BPs. ?RTC 1 year. ? ? ? ?This visit occurred during the SARS-CoV-2 public health emergency.  Safety protocols were in place, including screening questions prior to the visit, additional usage of staff PPE,  and extensive cleaning of exam room while observing appropriate contact time as indicated for disinfecting solutions.  ? ?

## 2021-12-16 ENCOUNTER — Encounter: Payer: Self-pay | Admitting: Internal Medicine

## 2021-12-16 LAB — CBC WITH DIFFERENTIAL/PLATELET
Absolute Monocytes: 479 cells/uL (ref 200–950)
Basophils Absolute: 19 cells/uL (ref 0–200)
Basophils Relative: 0.3 %
Eosinophils Absolute: 19 cells/uL (ref 15–500)
Eosinophils Relative: 0.3 %
HCT: 46 % (ref 38.5–50.0)
Hemoglobin: 15.6 g/dL (ref 13.2–17.1)
Lymphs Abs: 2589 cells/uL (ref 850–3900)
MCH: 29.2 pg (ref 27.0–33.0)
MCHC: 33.9 g/dL (ref 32.0–36.0)
MCV: 86 fL (ref 80.0–100.0)
MPV: 9.3 fL (ref 7.5–12.5)
Monocytes Relative: 7.6 %
Neutro Abs: 3194 cells/uL (ref 1500–7800)
Neutrophils Relative %: 50.7 %
Platelets: 262 10*3/uL (ref 140–400)
RBC: 5.35 10*6/uL (ref 4.20–5.80)
RDW: 15.6 % — ABNORMAL HIGH (ref 11.0–15.0)
Total Lymphocyte: 41.1 %
WBC: 6.3 10*3/uL (ref 3.8–10.8)

## 2021-12-16 LAB — COMPREHENSIVE METABOLIC PANEL
AG Ratio: 1.6 (calc) (ref 1.0–2.5)
ALT: 19 U/L (ref 9–46)
AST: 20 U/L (ref 10–35)
Albumin: 4.4 g/dL (ref 3.6–5.1)
Alkaline phosphatase (APISO): 80 U/L (ref 35–144)
BUN: 15 mg/dL (ref 7–25)
CO2: 24 mmol/L (ref 20–32)
Calcium: 9.4 mg/dL (ref 8.6–10.3)
Chloride: 105 mmol/L (ref 98–110)
Creat: 1.17 mg/dL (ref 0.70–1.30)
Globulin: 2.8 g/dL (calc) (ref 1.9–3.7)
Glucose, Bld: 82 mg/dL (ref 65–99)
Potassium: 4.5 mmol/L (ref 3.5–5.3)
Sodium: 141 mmol/L (ref 135–146)
Total Bilirubin: 0.9 mg/dL (ref 0.2–1.2)
Total Protein: 7.2 g/dL (ref 6.1–8.1)

## 2021-12-16 LAB — HEMOGLOBIN A1C
Hgb A1c MFr Bld: 5.8 % of total Hgb — ABNORMAL HIGH (ref <5.7)
Mean Plasma Glucose: 120 mg/dL
eAG (mmol/L): 6.6 mmol/L

## 2021-12-16 LAB — LIPID PANEL
Cholesterol: 191 mg/dL (ref <200)
HDL: 34 mg/dL — ABNORMAL LOW (ref >=40)
LDL Cholesterol (Calc): 136 mg/dL (calc) — ABNORMAL HIGH
Non-HDL Cholesterol (Calc): 157 mg/dL (calc) — ABNORMAL HIGH (ref <130)
Total CHOL/HDL Ratio: 5.6 (calc) — ABNORMAL HIGH (ref <5.0)
Triglycerides: 106 mg/dL (ref <150)

## 2021-12-16 LAB — TSH: TSH: 2.01 mIU/L (ref 0.40–4.50)

## 2021-12-16 LAB — PSA: PSA: 1.66 ng/mL (ref <=4.00)

## 2021-12-16 NOTE — Assessment & Plan Note (Signed)
Here for CPX ?Hyperglycemia: Check A1c ?HTN: Self DC amlodipine several months ago, no recent ambulatory BPs, doing better with diet, trying to exercise more.  BP today is great.  Recommend to continue his healthy lifestyle and check ambulatory BPs. ?RTC 1 year. ?

## 2021-12-16 NOTE — Assessment & Plan Note (Signed)
-  Td 2020 ?-COVID vaccine UTD ?- shingrix UTD ?-CCS: Cscope 2019, next 12/2022 per GI letter ?-prostate ca screening DRE normal, check PSA ?-Labs: CMP, FLP, CBC, A1c, TSH, PSA ?-Diet exercise: Doing well, active, watching diet. ? ? ?  ?

## 2022-11-29 ENCOUNTER — Encounter: Payer: Self-pay | Admitting: Gastroenterology

## 2022-12-21 ENCOUNTER — Encounter: Payer: Self-pay | Admitting: Internal Medicine

## 2022-12-21 ENCOUNTER — Ambulatory Visit (INDEPENDENT_AMBULATORY_CARE_PROVIDER_SITE_OTHER): Payer: 59 | Admitting: Internal Medicine

## 2022-12-21 VITALS — BP 122/72 | HR 72 | Temp 98.1°F | Resp 16 | Ht 75.0 in | Wt 209.5 lb

## 2022-12-21 DIAGNOSIS — Z0000 Encounter for general adult medical examination without abnormal findings: Secondary | ICD-10-CM

## 2022-12-21 DIAGNOSIS — R739 Hyperglycemia, unspecified: Secondary | ICD-10-CM | POA: Diagnosis not present

## 2022-12-21 DIAGNOSIS — I10 Essential (primary) hypertension: Secondary | ICD-10-CM | POA: Diagnosis not present

## 2022-12-21 DIAGNOSIS — E119 Type 2 diabetes mellitus without complications: Secondary | ICD-10-CM

## 2022-12-21 LAB — COMPREHENSIVE METABOLIC PANEL
ALT: 22 U/L (ref 0–53)
AST: 20 U/L (ref 0–37)
Albumin: 4.3 g/dL (ref 3.5–5.2)
Alkaline Phosphatase: 77 U/L (ref 39–117)
BUN: 13 mg/dL (ref 6–23)
CO2: 29 mEq/L (ref 19–32)
Calcium: 9.4 mg/dL (ref 8.4–10.5)
Chloride: 104 mEq/L (ref 96–112)
Creatinine, Ser: 1.14 mg/dL (ref 0.40–1.50)
GFR: 72.25 mL/min (ref >60.00)
Glucose, Bld: 99 mg/dL (ref 70–99)
Potassium: 3.8 mEq/L (ref 3.5–5.1)
Sodium: 140 mEq/L (ref 135–145)
Total Bilirubin: 0.6 mg/dL (ref 0.2–1.2)
Total Protein: 6.9 g/dL (ref 6.0–8.3)

## 2022-12-21 LAB — CBC WITH DIFFERENTIAL/PLATELET
Basophils Absolute: 0 10*3/uL (ref 0.0–0.1)
Basophils Relative: 0.3 % (ref 0.0–3.0)
Eosinophils Absolute: 0 10*3/uL (ref 0.0–0.7)
Eosinophils Relative: 0.6 % (ref 0.0–5.0)
HCT: 44.2 % (ref 39.0–52.0)
Hemoglobin: 15.3 g/dL (ref 13.0–17.0)
Lymphocytes Relative: 43.9 % (ref 12.0–46.0)
Lymphs Abs: 2.7 10*3/uL (ref 0.7–4.0)
MCHC: 34.6 g/dL (ref 30.0–36.0)
MCV: 86 fl (ref 78.0–100.0)
Monocytes Absolute: 0.4 10*3/uL (ref 0.1–1.0)
Monocytes Relative: 7 % (ref 3.0–12.0)
Neutro Abs: 3 10*3/uL (ref 1.4–7.7)
Neutrophils Relative %: 48.2 % (ref 43.0–77.0)
Platelets: 299 10*3/uL (ref 150.0–400.0)
RBC: 5.14 Mil/uL (ref 4.22–5.81)
RDW: 14.6 % (ref 11.5–15.5)
WBC: 6.2 10*3/uL (ref 4.0–10.5)

## 2022-12-21 LAB — LIPID PANEL
Cholesterol: 181 mg/dL (ref 0–200)
HDL: 29.9 mg/dL — ABNORMAL LOW (ref >39.00)
LDL Cholesterol: 120 mg/dL — ABNORMAL HIGH (ref 0–99)
NonHDL: 151.52
Total CHOL/HDL Ratio: 6
Triglycerides: 157 mg/dL — ABNORMAL HIGH (ref 0.0–149.0)
VLDL: 31.4 mg/dL (ref 0.0–40.0)

## 2022-12-21 LAB — HEMOGLOBIN A1C: Hgb A1c MFr Bld: 6.5 % (ref 4.6–6.5)

## 2022-12-21 LAB — PSA: PSA: 1.94 ng/mL (ref 0.10–4.00)

## 2022-12-21 NOTE — Progress Notes (Unsigned)
   Subjective:    Patient ID: Jonathan Cohen, male    DOB: 21-May-1966, 57 y.o.   MRN: [id number]  DOS:  12/21/2022 Type of visit - description: CPX  Here for CPX. Doing well, has no concerns  Review of Systems See above   Past Medical History:  Diagnosis Date   Annual physical exam 09/13/2011   Back injury     Past Surgical History:  Procedure Laterality Date   LIPOMA EXCISION N/A 11/2016   from posterior  neck     No current outpatient medications     Objective:   Physical Exam BP 122/72   Pulse 72   Temp 98.1 F (36.7 C) (Oral)   Resp 16   Ht 6\' 3"  (1.905 m)   Wt 209 lb 8 oz (95 kg)   SpO2 99%   BMI 26.19 kg/m  General: Well developed, NAD, BMI noted Neck: No  thyromegaly  HEENT:  Normocephalic . Face symmetric, atraumatic Lungs:  CTA B Normal respiratory effort, no intercostal retractions, no accessory muscle use. Heart: RRR,  no murmur.  Abdomen:  Not distended, soft, non-tender. No rebound or rigidity.   Lower extremities: no pretibial edema bilaterally  Skin: Exposed areas without rash. Not pale. Not jaundice Neurologic:  alert & oriented X3.  Speech normal, gait appropriate for age and unassisted Strength symmetric and appropriate for age.  Psych: Cognition and judgment appear intact.  Cooperative with normal attention span and concentration.  Behavior appropriate. No anxious or depressed appearing.     Assessment   ASSESSMENT Hyperglycemia: A1c 5.9 (10-2015) HTN dx 05/2020 Sebaceous cyst, neck     PLAN Here for CPX Hyperglycemia: Will check a A1c, explained to patient what that means.  See AVS. HTN?  Currently on no medications, BP is normal. RTC 1 year   -Td 2020 -COVID vaccine UTD - shingrix x2 -CCS: Cscope 2019, next is due, patient aware, GI letter reprinted. -prostate ca screening no FH, no symptoms, check PSA -Labs: CMP FLP CBC A1c PSA -Diet: Doing okay, encourage portion control, less carbohydrates.  Exercise: Has been  trying to be more active lately. praised 3=== Hyperglycemia: Check A1c HTN: Self DC amlodipine several months ago, no recent ambulatory BPs, doing better with diet, trying to exercise more.  BP today is great.  Recommend to continue his healthy lifestyle and check ambulatory BPs. RTC 1 year.    This visit occurred during the SARS-CoV-2 public health emergency.  Safety protocols were in place, including screening questions prior to the visit, additional usage of staff PPE, and extensive cleaning of exam room while observing appropriate contact time as indicated for disinfecting solutions.

## 2022-12-21 NOTE — Patient Instructions (Addendum)
Please read the information about the hemoglobin A1c  Please reach out to gastro urology and schedule a colonoscopy. [phone]  GO TO THE LAB : Get the blood work     GO TO THE FRONT DESK, PLEASE SCHEDULE YOUR APPOINTMENTS Come back for   a physical exam in 1 year    Hemoglobin A1C Test   This test may be done with other blood glucose tests, such as fasting blood glucose and oral glucose tolerance tests. What is being tested? Hemoglobin is a type of protein in the blood that carries oxygen. Glucose attaches to hemoglobin to form glycated hemoglobin. This test checks the amount of glycated hemoglobin in your blood. This is a good indicator of the average amount of glucose in your blood during the past 2-3 months. What kind of sample is taken?  A blood sample is required for this test. It is usually collected by inserting a needle into a blood vessel. Tell a health care provider about: All medicines you are taking, including vitamins, herbs, eye drops, creams, and over-the-counter medicines. Any blood disorders you have. Any surgeries you have had. Any medical conditions you have. Whether you are pregnant or may be pregnant. How are the results reported? Your results will be reported as a percentage indicating how much of your hemoglobin has glucose attached to it. Your health care provider will compare your results to normal ranges established after testing a large group of people (reference ranges). Reference ranges may vary among labs and hospitals. For this test, common reference ranges are: Adult or child without diabetes: 4-5.6%. Adult or child with prediabetes: 5.7%-6.4%. Adult or child with diabetes: 6.5% or higher. What do the results mean? If you do not have diabetes: A result within the reference range means that you are not at high risk for diabetes. A result of 5.7-6.4% means that you have a high risk of developing diabetes, and you have prediabetes. Having  prediabetes puts you at risk for developing type 2 diabetes. You may have more tests, including a repeat A1C test. Results of 6.5% or higher on two separate A1C tests mean that you have diabetes. You may have more tests to confirm the diagnosis. If you have diabetes: A result of 7% usually means that your diabetes is under control. A result of less than 7% also means that diabetes is under control. This result may be an appropriate goal for those for whom there is a low risk of low blood sugar (hypoglycemia). A result of less than 8% may also mean that diabetes is under control. This result may be an appropriate goal for those who do not benefit from more blood glucose control or who are at high risk for hypoglycemia. Abnormally low A1C values may be caused by: Pregnancy. Severe blood loss. Receiving donated blood (transfusions). Low red blood cell count (anemia). Long-term kidney failure. Some unusual forms (variants) of hemoglobin. Talk with your health care provider about what your results mean. Questions to ask your health care provider Ask your health care provider, or the department that is doing the test: When will my results be ready? How will I get my results? What are my treatment options? What other tests do I need? What are my next steps? Summary The A1C test is done to check your risk for developing diabetes, diagnose diabetes, and check the long-term control of blood sugar (glucose) in people who have diabetes and help make treatment decisions. Hemoglobin is a type of protein in the  blood that carries oxygen. Glucose attaches to hemoglobin to form glycated hemoglobin. This test checks the amount of glycated hemoglobin in your blood. Talk with your health care provider about what your results mean. This information is not intended to replace advice given to you by your health care provider. Make sure you discuss any questions you have with your health care provider. Document  Revised: 04/10/2022 Document Reviewed: 10/18/2021 Elsevier Patient Education  2023 [employer]

## 2022-12-22 ENCOUNTER — Encounter: Payer: Self-pay | Admitting: Internal Medicine

## 2022-12-22 NOTE — Assessment & Plan Note (Signed)
-  Td 2020 -COVID vaccine UTD - shingrix x2 -CCS: Cscope 2019, next is due, patient aware, GI letter reprinted. -prostate ca screening no FH, no symptoms, check PSA -Labs: CMP FLP CBC A1c PSA -Diet: Doing okay, encourage portion control, less carbohydrates.  Exercise: Has been trying to be more active lately. praised

## 2022-12-22 NOTE — Assessment & Plan Note (Signed)
Here for CPX Hyperglycemia: Will check a A1c, explained to patient what a A1C  means.  See AVS. HTN?  Currently on no medications, BP is normal. RTC 1 year

## 2022-12-24 ENCOUNTER — Encounter: Payer: Self-pay | Admitting: Internal Medicine

## 2022-12-24 DIAGNOSIS — E119 Type 2 diabetes mellitus without complications: Secondary | ICD-10-CM | POA: Insufficient documentation

## 2022-12-24 NOTE — Addendum Note (Signed)
Addended byConrad Atoka D on: 12/24/2022 08:02 AM   Modules accepted: Orders

## 2022-12-28 ENCOUNTER — Encounter: Payer: Self-pay | Admitting: Internal Medicine

## 2023-02-05 ENCOUNTER — Ambulatory Visit (AMBULATORY_SURGERY_CENTER): Payer: 59 | Admitting: *Deleted

## 2023-02-05 VITALS — Ht 75.0 in | Wt 205.0 lb

## 2023-02-05 DIAGNOSIS — Z800 Family history of malignant neoplasm of digestive organs: Secondary | ICD-10-CM

## 2023-02-05 DIAGNOSIS — Z85038 Personal history of other malignant neoplasm of large intestine: Secondary | ICD-10-CM

## 2023-02-05 DIAGNOSIS — Z86010 Personal history of colonic polyps: Secondary | ICD-10-CM

## 2023-02-05 MED ORDER — NA SULFATE-K SULFATE-MG SULF 17.5-3.13-1.6 GM/177ML PO SOLN
1.0000 | Freq: Once | ORAL | 0 refills | Status: AC
Start: 2023-02-05 — End: 2023-02-05

## 2023-02-05 NOTE — Progress Notes (Signed)
Pt's name and DOB verified at the beginning of the pre-visit.  Pt denies any difficulty with ambulating,sitting, laying down or rolling side to side Gave both [facility] main # and MD on call # prior to instructions.  No egg or soy allergy known to patient  No issues known to pt with past sedation with any surgeries or procedures Pt denies having issues being intubated Pt has no issues moving head neck or swallowing No FH of Malignant Hyperthermia Pt is not on diet pills Pt is not on home 02  Pt is not on blood thinners  Pt denies issues with constipation l Pt is not on dialysis Pt denise any abnormal heart rhythms  Pt denies any upcoming cardiac testing Pt encouraged to use to use Singlecare or Goodrx to reduce cost  Patient's chart reviewed by Jonathan Cohen CNRA prior to pre-visit and patient appropriate for the [facility].  Pre-visit completed and red dot placed by patient's name on their procedure day (on provider's schedule).  . Visit by phone Pt states weight 205 lbs  Instructed pt why it is important to and  to call if they have any changes in health or new medications. Directed them to the # given and on instructions.   Pt states they will.  Instructions reviewed with pt and pt states understanding. Instructed to review again prior to procedure. Pt states they will.  Instructions sent by mail with coupon and by my chart

## 2023-02-21 ENCOUNTER — Encounter: Payer: Self-pay | Admitting: Gastroenterology

## 2023-03-08 ENCOUNTER — Encounter: Payer: Self-pay | Admitting: Gastroenterology

## 2023-03-08 ENCOUNTER — Telehealth: Payer: Self-pay | Admitting: Gastroenterology

## 2023-03-08 ENCOUNTER — Ambulatory Visit (AMBULATORY_SURGERY_CENTER): Payer: 59 | Admitting: Gastroenterology

## 2023-03-08 VITALS — BP 107/86 | HR 71 | Temp 97.7°F | Resp 14 | Ht 75.0 in | Wt 205.0 lb

## 2023-03-08 DIAGNOSIS — Z800 Family history of malignant neoplasm of digestive organs: Secondary | ICD-10-CM

## 2023-03-08 DIAGNOSIS — D123 Benign neoplasm of transverse colon: Secondary | ICD-10-CM | POA: Diagnosis not present

## 2023-03-08 DIAGNOSIS — Z09 Encounter for follow-up examination after completed treatment for conditions other than malignant neoplasm: Secondary | ICD-10-CM

## 2023-03-08 DIAGNOSIS — Z86010 Personal history of colonic polyps: Secondary | ICD-10-CM

## 2023-03-08 MED ORDER — SODIUM CHLORIDE 0.9 % IV SOLN
500.0000 mL | Freq: Once | INTRAVENOUS | Status: DC
Start: 2023-03-08 — End: 2023-03-08

## 2023-03-08 NOTE — Op Note (Signed)
[facility] Patient Name: Jonathan Cohen Procedure Date: 03/08/2023 9:57 AM MRN: [id number] Endoscopist: Meryl Dare , MD, [id number] Age: 57 Referring MD:  Date of Birth: 1965/12/17 Gender: Male Account #: [id number] Procedure:                Colonoscopy Indications:              Surveillance: Personal history of adenomatous                            polyps on last colonoscopy 5 years ago, Family                            history of colon cancer, 1st-degree relative Medicines:                Monitored Anesthesia Care Procedure:                Pre-Anesthesia Assessment:                           - Prior to the procedure, a History and Physical                            was performed, and patient medications and                            allergies were reviewed. The patient's tolerance of                            previous anesthesia was also reviewed. The risks                            and benefits of the procedure and the sedation                            options and risks were discussed with the patient.                            All questions were answered, and informed consent                            was obtained. Prior Anticoagulants: The patient has                            taken no anticoagulant or antiplatelet agents. ASA                            Grade Assessment: II - A patient with mild systemic                            disease. After reviewing the risks and benefits,                            the patient was deemed in satisfactory condition to  undergo the procedure.                           After obtaining informed consent, the colonoscope                            was passed under direct vision. Throughout the                            procedure, the patient's blood pressure, pulse, and                            oxygen saturations were monitored continuously. The                            CF [id number] #[id number]  was introduced through the anus                            and advanced to the the cecum, identified by                            appendiceal orifice and ileocecal valve. The                            ileocecal valve, appendiceal orifice, and rectum                            were photographed. The quality of the bowel                            preparation was good. The colonoscopy was performed                            without difficulty. The patient tolerated the                            procedure well. Scope In: 10:16:57 AM Scope Out: 10:36:31 AM Scope Withdrawal Time: 0 hours 12 minutes 59 seconds  Total Procedure Duration: 0 hours 19 minutes 34 seconds  Findings:                 The perianal and digital rectal examinations were                            normal.                           Three sessile polyps were found in the transverse                            colon. The polyps were 6 to 7 mm in size. These                            polyps were removed with a cold snare. Resection  and retrieval were complete.                           A few small-mouthed diverticula were found in the                            ascending colon and cecum. There was no evidence of                            diverticular bleeding.                           The exam was otherwise without abnormality on                            direct and retroflexion views. Complications:            No immediate complications. Estimated blood loss:                            None. Estimated Blood Loss:     Estimated blood loss: none. Impression:               - Three 6 to 7 mm polyps in the transverse colon,                            removed with a cold snare. Resected and retrieved.                           - Mild diverticulosis in the ascending colon and in                            the cecum.                           - The examination was otherwise normal on direct                             and retroflexion views. Recommendation:           - Repeat colonoscopy after studies are complete for                            surveillance based on pathology results.                           - Patient has a contact number available for                            emergencies. The signs and symptoms of potential                            delayed complications were discussed with the                            patient. Return to normal activities tomorrow.  Written discharge instructions were provided to the                            patient.                           - Resume previous diet.                           - Continue present medications.                           - Await pathology results. Meryl Dare, MD 03/08/2023 10:45:55 AM This report has been signed electronically.

## 2023-03-08 NOTE — Progress Notes (Signed)
History & Physical  Primary Care Physician:  Wanda Plump, MD Primary Gastroenterologist: Claudette Head, MD  Impression / Plan:  Personal history of adenomatous colon polyps, family history of colon cancer for colonoscopy  CHIEF COMPLAINT:  [facility], Personal history of colon polyps   HPI: Jonathan Cohen is a 57 y.o. male with a personal history of adenomatous colon polyps and a family history of colon cancer for colonoscopy.    Past Medical History:  Diagnosis Date   Annual physical exam 09/13/2011   Back injury     Past Surgical History:  Procedure Laterality Date   COLONOSCOPY     LIPOMA EXCISION N/A 11/2016   from posterior  neck     Prior to Admission medications   Medication Sig Start Date End Date Taking? Authorizing Provider  Multiple Vitamins-Minerals (MENS MULTIVITAMIN PO) Take by mouth.    [provider]    Current Outpatient Medications  Medication Sig Dispense Refill   Multiple Vitamins-Minerals (MENS MULTIVITAMIN PO) Take by mouth.     Current Facility-Administered Medications  Medication Dose Route Frequency Provider Last Rate Last Admin   0.9 %  sodium chloride infusion  500 mL Intravenous Once Meryl Dare, MD        Allergies as of 03/08/2023   (No Known Allergies)    Family History  Problem Relation Age of Onset   Hyperlipidemia Mother    Heart disease Father 80       MI   Colon cancer Sister 19   Diabetes Neg Hx    Rectal cancer Neg Hx    Prostate cancer Neg Hx    Colon polyps Neg Hx    Esophageal cancer Neg Hx    Stomach cancer Neg Hx     Social History   Socioeconomic History   Marital status: Married    Spouse name: Not on file   Number of children: 3   Years of education: Not on file   Highest education level: Not on file  Occupational History   Occupation: Building services engineer: [employer]  Tobacco Use   Smoking status: Never   Smokeless tobacco: Never   Tobacco comments:    Never used  Vaping Use    Vaping Use: Never used  Substance and Sexual Activity   Alcohol use: No   Drug use: No   Sexual activity: Yes    Birth control/protection: None  Other Topics Concern   Not on file  Social History Narrative   Cowboys fan!   Household-- pt, wife and 2 children   3 children-- 1999;  2001; 2006   Social Determinants of Corporate investment banker Strain: Not on [employer] Insecurity: Not on file  Transportation Needs: Not on file  Physical Activity: Not on file  Stress: Not on file  Social Connections: Not on file  Intimate Partner Violence: Not on file    Review of Systems:  All systems reviewed were negative except where noted in HPI.   Physical Exam:  General:  Alert, well-developed, in NAD Head:  Normocephalic and atraumatic. Eyes:  Sclera clear, no icterus.   Conjunctiva pink. Ears:  Normal auditory acuity. Mouth:  No deformity or lesions.  Neck:  Supple; no masses. Lungs:  Clear throughout to auscultation.   No wheezes, crackles, or rhonchi.  Heart:  Regular rate and rhythm; no murmurs. Abdomen:  Soft, nondistended, nontender. No masses, hepatomegaly. No palpable masses.  Normal bowel sounds.  Rectal:  Deferred   Msk:  Symmetrical without gross deformities. Extremities:  Without edema. Neurologic:  Alert and  oriented x 4; grossly normal neurologically. Skin:  Intact without significant lesions or rashes. Psych:  Alert and cooperative. Normal mood and affect.   Venita Lick. Russella Dar  03/08/2023, 10:00 AM See Loretha Stapler, [facility], to contact our on call provider

## 2023-03-08 NOTE — Progress Notes (Signed)
A and O x3. Report to RN. Tolerated MAC anesthesia well. 

## 2023-03-08 NOTE — Progress Notes (Signed)
Called to room to assist during endoscopic procedure.  Patient ID and intended procedure confirmed with present staff. Received instructions for my participation in the procedure from the performing physician.  

## 2023-03-08 NOTE — Progress Notes (Signed)
VS completed by DT.  Pt's states no medical or surgical changes since previsit or office visit.  

## 2023-03-08 NOTE — Patient Instructions (Addendum)
Resume previous diet. Continue present medications. Await pathology results.   YOU HAD AN ENDOSCOPIC PROCEDURE TODAY AT THE [facility]:   Refer to the procedure report that was given to you for any specific questions about what was found during the examination.  If the procedure report does not answer your questions, please call your gastroenterologist to clarify.  If you requested that your care partner not be given the details of your procedure findings, then the procedure report has been included in a sealed envelope for you to review at your convenience later.  YOU SHOULD EXPECT: Some feelings of bloating in the abdomen. Passage of more gas than usual.  Walking can help get rid of the air that was put into your GI tract during the procedure and reduce the bloating. If you had a lower endoscopy (such as a colonoscopy or flexible sigmoidoscopy) you may notice spotting of blood in your stool or on the toilet paper. If you underwent a bowel prep for your procedure, you may not have a normal bowel movement for a few days.  Please Note:  You might notice some irritation and congestion in your nose or some drainage.  This is from the oxygen used during your procedure.  There is no need for concern and it should clear up in a day or so.  SYMPTOMS TO REPORT IMMEDIATELY:  Following lower endoscopy (colonoscopy or flexible sigmoidoscopy):  Excessive amounts of blood in the stool  Significant tenderness or worsening of abdominal pains  Swelling of the abdomen that is new, acute  Fever of 100F or higher   For urgent or emergent issues, a gastroenterologist can be reached at any hour by calling [phone]. Do not use MyChart messaging for urgent concerns.    DIET:  We do recommend a small meal at first, but then you may proceed to your regular diet.  Drink plenty of fluids but you should avoid alcoholic beverages for 24 hours.  ACTIVITY:  You should plan to take it easy for the  rest of today and you should NOT DRIVE or use heavy machinery until tomorrow (because of the sedation medicines used during the test).    FOLLOW UP: Our staff will call the number listed on your records the next business day following your procedure.  We will call around 7:15- 8:00 am to check on you and address any questions or concerns that you may have regarding the information given to you following your procedure. If we do not reach you, we will leave a message.     If any biopsies were taken you will be contacted by phone or by letter within the next 1-3 weeks.  Please call us at [phone] if you have not heard about the biopsies in 3 weeks.    SIGNATURES/CONFIDENTIALITY: You and/or your care partner have signed paperwork which will be entered into your electronic medical record.  These signatures attest to the fact that that the information above on your After Visit Summary has been reviewed and is understood.  Full responsibility of the confidentiality of this discharge information lies with you and/or your care-partner. 

## 2023-03-08 NOTE — Telephone Encounter (Signed)
Patient returning call, states he can come in 30 min's early. Please advise if any additional questions.

## 2023-03-12 ENCOUNTER — Telehealth: Payer: Self-pay | Admitting: *Deleted

## 2023-03-12 NOTE — Telephone Encounter (Signed)
  Follow up Call-     03/08/2023    9:19 AM  Call back number  Post procedure Call Back phone  # [phone]  Permission to leave phone message Yes     Patient questions:   Message left to call us if necessary.

## 2023-03-24 ENCOUNTER — Encounter: Payer: Self-pay | Admitting: Gastroenterology

## 2023-05-24 ENCOUNTER — Other Ambulatory Visit (INDEPENDENT_AMBULATORY_CARE_PROVIDER_SITE_OTHER): Payer: 59

## 2023-05-24 DIAGNOSIS — E119 Type 2 diabetes mellitus without complications: Secondary | ICD-10-CM

## 2023-05-24 LAB — LIPID PANEL
Cholesterol: 171 mg/dL (ref 0–200)
HDL: 33 mg/dL — ABNORMAL LOW (ref >39.00)
LDL Cholesterol: 111 mg/dL — ABNORMAL HIGH (ref 0–99)
NonHDL: 137.55
Total CHOL/HDL Ratio: 5
Triglycerides: 134 mg/dL (ref 0.0–149.0)
VLDL: 26.8 mg/dL (ref 0.0–40.0)

## 2023-05-24 LAB — HEMOGLOBIN A1C: Hgb A1c MFr Bld: 5.8 % (ref 4.6–6.5)

## 2023-07-12 ENCOUNTER — Encounter: Payer: Self-pay | Admitting: Internal Medicine

## 2023-12-27 ENCOUNTER — Encounter: Payer: Self-pay | Admitting: Internal Medicine

## 2023-12-27 ENCOUNTER — Ambulatory Visit (INDEPENDENT_AMBULATORY_CARE_PROVIDER_SITE_OTHER): Payer: 59 | Admitting: Internal Medicine

## 2023-12-27 VITALS — BP 126/80 | HR 84 | Temp 97.8°F | Resp 16 | Ht 75.0 in | Wt 212.4 lb

## 2023-12-27 DIAGNOSIS — Z0000 Encounter for general adult medical examination without abnormal findings: Secondary | ICD-10-CM

## 2023-12-27 DIAGNOSIS — E785 Hyperlipidemia, unspecified: Secondary | ICD-10-CM | POA: Diagnosis not present

## 2023-12-27 DIAGNOSIS — R739 Hyperglycemia, unspecified: Secondary | ICD-10-CM | POA: Insufficient documentation

## 2023-12-27 DIAGNOSIS — I10 Essential (primary) hypertension: Secondary | ICD-10-CM | POA: Diagnosis not present

## 2023-12-27 DIAGNOSIS — E119 Type 2 diabetes mellitus without complications: Secondary | ICD-10-CM | POA: Insufficient documentation

## 2023-12-27 NOTE — Patient Instructions (Addendum)
 Vaccines I recommend: Flu shot every fall Consider getting a COVID booster  Read information below about a healthcare power of attorney . Watch your diet closely  GO TO THE LAB : Get the blood work     Please go to the front desk: Arrange for a follow-up in 1 year.    Per our records you are due for your diabetic eye exam. Please contact your eye doctor to schedule an appointment. Please have them send copies of your office visit notes to [place]. Our fax number is [phone]. If you need a referral to an eye doctor please let us know.     "Health Care Power of attorney" (Also know as a  "Living will" or  Advance care planning documents)  If you already have a living will or healthcare power of attorney, is recommended you bring the copy to be scanned in your chart.   The document will be available to all the doctors you see in the system.  If you are over 63 y/o and don't have the document, please read:  Advance care planning is a process that supports adults in  understanding and sharing their preferences regarding future medical care.  The patient's preferences are recorded in documents called Advance Directives and the can be modified at any time while the patient is in full mental capacity.     More information at: [email or web address]

## 2023-12-27 NOTE — Progress Notes (Signed)
 Subjective:    Patient ID: Jonathan Cohen, male    DOB: Jan 29, 1966, 58 y.o.   MRN: [id number]  DOS:  12/27/2023 Type of visit - description: cpx  Here for CPX.  Feeling well.  Has no major concerns.  Review of Systems   A 14 point review of systems is negative    Past Medical History:  Diagnosis Date   Annual physical exam 09/13/2011   Back injury    Social History   Socioeconomic History   Marital status: Married    Spouse name: Not on file   Number of children: 3   Years of education: Not on file   Highest education level: Not on file  Occupational History   Occupation: Building services engineer: [employer]  Tobacco Use   Smoking status: Never   Smokeless tobacco: Never   Tobacco comments:    Never used  Vaping Use   Vaping status: Never Used  Substance and Sexual Activity   Alcohol use: No   Drug use: No   Sexual activity: Yes    Birth control/protection: None  Other Topics Concern   Not on file  Social History Narrative   Cowboys fan!   Household-- pt, wife and 2 children   3 children-- 1999;  2001; 2006   Social Drivers of Corporate investment banker Strain: Not on [employer] Insecurity: Not on file  Transportation Needs: Not on file  Physical Activity: Not on file  Stress: Not on file  Social Connections: Not on file  Intimate Partner Violence: Not on file    Past Surgical History:  Procedure Laterality Date   COLONOSCOPY     LIPOMA EXCISION N/A 11/2016   from posterior  neck     No current outpatient medications      Objective:   Physical Exam BP 126/80   Pulse 84   Temp 97.8 F (36.6 C) (Oral)   Resp 16   Ht 6\' 3"  (1.905 m)   Wt 212 lb 6 oz (96.3 kg)   SpO2 98%   BMI 26.55 kg/m  General: Well developed, NAD, BMI noted Neck: No  thyromegaly  HEENT:  Normocephalic . Face symmetric, atraumatic Lungs:  CTA B Normal respiratory effort, no intercostal retractions, no accessory muscle use. Heart: RRR,  no murmur.  Abdomen:   Not distended, soft, non-tender. No rebound or rigidity.   Lower extremities: no pretibial edema bilaterally  Skin: Exposed areas without rash. Not pale. Not jaundice Neurologic:  alert & oriented X3.  Speech normal, gait appropriate for age and unassisted Strength symmetric and appropriate for age.  Psych: Cognition and judgment appear intact.  Cooperative with normal attention span and concentration.  Behavior appropriate. No anxious or depressed appearing.     Assessment     ASSESSMENT Hyperglycemia: A1c 5.9 (10-2015) Sebaceous cyst, neck    PLAN Here for CPX -Td 2020 - shingrix x2 - Recommend a flu shot yearly and consider COVID-vaccine -CCS: Cscope 2019, Cscope 02/2023 next  3 years per GI letter  -prostate ca screening no FH, no symptoms, check PSA -Labs: CBC CMP A1c FLP PSA micro -Diet: We had a good conversation about portion control, limit carbohydrate, exercise 3 hours a week. Other issues: Hyperglycemia:  his A1c was 6.5 one time only otherwise A1c's are below 6.  Again we talk about diet exercise. Dyslipidemia: Qualifies for statin therapy, benefits of statins discussed including decrease cardiovascular risk.  Patient will consider.  Further advice  will results RTC 1 year

## 2023-12-27 NOTE — Assessment & Plan Note (Signed)
 Here for CPX  Other issues: Hyperglycemia:  his A1c was 6.5 one time only otherwise A1c's are below 6.  Again we talk about diet exercise. Dyslipidemia: Qualifies for statin therapy, benefits of statins discussed including decrease cardiovascular risk.  Patient will consider.  Further advice will results RTC 1 year

## 2023-12-27 NOTE — Assessment & Plan Note (Signed)
 Here for CPX -Td 2020 - shingrix x2 - Recommend a flu shot yearly and consider COVID-vaccine -CCS: Cscope 2019, Cscope 02/2023 next  3 years per GI letter  -prostate ca screening no FH, no symptoms, check PSA -Labs: CBC CMP A1c FLP PSA micro -Diet: We had a good conversation about portion control, limit carbohydrate, exercise 3 hours a week.

## 2023-12-28 LAB — COMPREHENSIVE METABOLIC PANEL WITH GFR
AG Ratio: 1.7 (calc) (ref 1.0–2.5)
ALT: 28 U/L (ref 9–46)
AST: 25 U/L (ref 10–35)
Albumin: 4.7 g/dL (ref 3.6–5.1)
Alkaline phosphatase (APISO): 87 U/L (ref 35–144)
BUN: 12 mg/dL (ref 7–25)
CO2: 30 mmol/L (ref 20–32)
Calcium: 9.9 mg/dL (ref 8.6–10.3)
Chloride: 102 mmol/L (ref 98–110)
Creat: 1.07 mg/dL (ref 0.70–1.30)
Globulin: 2.8 g/dL (ref 1.9–3.7)
Glucose, Bld: 99 mg/dL (ref 65–99)
Potassium: 4.7 mmol/L (ref 3.5–5.3)
Sodium: 139 mmol/L (ref 135–146)
Total Bilirubin: 0.8 mg/dL (ref 0.2–1.2)
Total Protein: 7.5 g/dL (ref 6.1–8.1)
eGFR: 81 mL/min/{1.73_m2} (ref >=60)

## 2023-12-28 LAB — CBC WITH DIFFERENTIAL/PLATELET
Absolute Lymphocytes: 2933 {cells}/uL (ref 850–3900)
Absolute Monocytes: 490 {cells}/uL (ref 200–950)
Basophils Absolute: 21 {cells}/uL (ref 0–200)
Basophils Relative: 0.3 %
Eosinophils Absolute: 69 {cells}/uL (ref 15–500)
Eosinophils Relative: 1 %
HCT: 49.4 % (ref 38.5–50.0)
Hemoglobin: 16.4 g/dL (ref 13.2–17.1)
MCH: 28.8 pg (ref 27.0–33.0)
MCHC: 33.2 g/dL (ref 32.0–36.0)
MCV: 86.7 fL (ref 80.0–100.0)
MPV: 9.6 fL (ref 7.5–12.5)
Monocytes Relative: 7.1 %
Neutro Abs: 3388 {cells}/uL (ref 1500–7800)
Neutrophils Relative %: 49.1 %
Platelets: 254 10*3/uL (ref 140–400)
RBC: 5.7 10*6/uL (ref 4.20–5.80)
RDW: 14.7 % (ref 11.0–15.0)
Total Lymphocyte: 42.5 %
WBC: 6.9 10*3/uL (ref 3.8–10.8)

## 2023-12-28 LAB — LIPID PANEL
Cholesterol: 188 mg/dL (ref <200)
HDL: 41 mg/dL (ref >=40)
LDL Cholesterol (Calc): 118 mg/dL — ABNORMAL HIGH
Non-HDL Cholesterol (Calc): 147 mg/dL — ABNORMAL HIGH (ref <130)
Total CHOL/HDL Ratio: 4.6 (calc) (ref <5.0)
Triglycerides: 170 mg/dL — ABNORMAL HIGH (ref <150)

## 2023-12-28 LAB — MICROALBUMIN / CREATININE URINE RATIO
Creatinine, Urine: 116 mg/dL (ref 20–320)
Microalb Creat Ratio: 9 mg/g{creat} (ref <30)
Microalb, Ur: 1 mg/dL

## 2023-12-28 LAB — HEMOGLOBIN A1C
Hgb A1c MFr Bld: 6.1 %{Hb} — ABNORMAL HIGH (ref <5.7)
Mean Plasma Glucose: 128 mg/dL
eAG (mmol/L): 7.1 mmol/L

## 2023-12-28 LAB — PSA: PSA: 1.98 ng/mL (ref <=4.00)

## 2023-12-29 ENCOUNTER — Encounter: Payer: Self-pay | Admitting: Internal Medicine

## 2023-12-30 MED ORDER — ATORVASTATIN CALCIUM 20 MG PO TABS: 20.0000 mg | ORAL_TABLET | Freq: Every day | ORAL | 4 refills | Status: DC

## 2023-12-30 NOTE — Addendum Note (Signed)
 Addended by: Taralee Marcus D on: 12/30/2023 08:00 AM   Modules accepted: Orders

## 2024-02-07 ENCOUNTER — Encounter: Payer: Self-pay | Admitting: Internal Medicine

## 2024-02-17 ENCOUNTER — Ambulatory Visit: Admitting: Internal Medicine

## 2024-02-17 VITALS — BP 126/82 | HR 76 | Temp 98.1°F | Resp 16 | Ht 75.0 in | Wt 210.0 lb

## 2024-02-17 DIAGNOSIS — E119 Type 2 diabetes mellitus without complications: Secondary | ICD-10-CM | POA: Diagnosis not present

## 2024-02-17 DIAGNOSIS — E785 Hyperlipidemia, unspecified: Secondary | ICD-10-CM | POA: Diagnosis not present

## 2024-02-17 DIAGNOSIS — N529 Male erectile dysfunction, unspecified: Secondary | ICD-10-CM | POA: Diagnosis not present

## 2024-02-17 LAB — LIPID PANEL
Cholesterol: 140 mg/dL (ref 0–200)
HDL: 33.7 mg/dL — ABNORMAL LOW (ref >39.00)
LDL Cholesterol: 79 mg/dL (ref 0–99)
NonHDL: 106.06
Total CHOL/HDL Ratio: 4
Triglycerides: 134 mg/dL (ref 0.0–149.0)
VLDL: 26.8 mg/dL (ref 0.0–40.0)

## 2024-02-17 LAB — AST: AST: 20 U/L (ref 0–37)

## 2024-02-17 LAB — ALT: ALT: 30 U/L (ref 0–53)

## 2024-02-17 NOTE — Progress Notes (Signed)
   Subjective:    Patient ID: Jonathan Cohen, male    DOB: [birth year], 58 y.o.   MRN: [id number]  DOS:  02/17/2024 Type of visit - description: Follow-up  In April, he was recommended atorvastatin . Since then, has noted decreased in the quality and duration of his erections. Libido is normal Denies any chest pain no difficulty breathing. No claudication. No LUTS. No unusual aches or pains.   Review of Systems See above   Past Medical History:  Diagnosis Date   Annual physical exam 09/13/2011   Back injury     Past Surgical History:  Procedure Laterality Date   COLONOSCOPY     LIPOMA EXCISION N/A 11/2016   from posterior  neck     Current Outpatient Medications  Medication Instructions   atorvastatin  (LIPITOR) 20 mg, Oral, Daily at bedtime       Objective:   Physical Exam BP 126/82   Pulse 76   Temp 98.1 F (36.7 C) (Oral)   Resp 16   Ht 6\' 3"  (1.905 m)   Wt 210 lb (95.3 kg)   SpO2 97%   BMI 26.25 kg/m  General:   Well developed, NAD, BMI noted.  HEENT:  Normocephalic . Face symmetric, atraumatic  Abdomen:  Not distended, soft, non-tender. No rebound or rigidity. Lower extremities: Normal femoral pulses Skin: Not pale. Not jaundice Neurologic:  alert & oriented X3.  Speech normal, gait appropriate for age and unassisted Psych--  Cognition and judgment appear intact.  Cooperative with normal attention span and concentration.  Behavior appropriate. No anxious or depressed appearing.     Assessment   ASSESSMENT Hyperglycemia: A1c 5.9 (10-2015) Sebaceous cyst, neck    PLAN Dyslipidemia: Last LDL 118, cardiovascular risk 13%,rx atorvastatin  20 mg.  Taking it regularly.  Developed a new onset of erectile dysfunction.  Stopped statins for a couple of days and felt somewhat better. We agreed on the following: - checking a FLP AST ALT to see the results of his statins -  Although ED would be very unusual atorvastatin  side effect we agreed to stop it and  see if the ED improves. Erectile dysfunction: See above, reassess in 6 months.    Hyperglycemia: Last A1c 6.1 RTC 6 months

## 2024-02-17 NOTE — Patient Instructions (Addendum)
 Stop  Atorvastatin   GO TO THE LAB :  Get the blood work   Your results will be posted on MyChart with my comments  Next office visit for a checkup in 6 months Please make an appointment before you leave today       Per our records you are due for your diabetic eye exam. Please contact your eye doctor to schedule an appointment. Please have them send copies of your office visit notes to us . Our fax number is [phone]. If you need a referral to an eye doctor please let us  know.

## 2024-02-17 NOTE — Assessment & Plan Note (Signed)
 Dyslipidemia: Last LDL 118, cardiovascular risk 13%,rx atorvastatin  20 mg.  Taking it regularly.  Developed a new onset of erectile dysfunction.  Stopped statins for a couple of days and felt somewhat better. We agreed on the following: - checking a FLP AST ALT to see the results of his statins - Although ED would be very unusual atorvastatin  side effect we agreed to stop it and see if the ED improves. Erectile dysfunction: See above, reassess in 6 months.    Hyperglycemia: Last A1c 6.1 RTC 6 months

## 2024-02-19 ENCOUNTER — Ambulatory Visit: Payer: Self-pay | Admitting: Internal Medicine

## 2024-03-29 ENCOUNTER — Other Ambulatory Visit: Payer: Self-pay | Admitting: Internal Medicine

## 2024-08-24 ENCOUNTER — Ambulatory Visit: Admitting: Internal Medicine

## 2024-09-30 ENCOUNTER — Ambulatory Visit: Admitting: Internal Medicine

## 2024-10-07 ENCOUNTER — Encounter: Payer: Self-pay | Admitting: Internal Medicine

## 2024-10-07 ENCOUNTER — Ambulatory Visit: Admitting: Internal Medicine

## 2024-10-07 VITALS — BP 130/82 | HR 76 | Temp 97.7°F | Resp 16 | Ht 75.0 in | Wt 212.0 lb

## 2024-10-07 DIAGNOSIS — E119 Type 2 diabetes mellitus without complications: Secondary | ICD-10-CM

## 2024-10-07 NOTE — Patient Instructions (Signed)
 Please read your instructions carefully.   GO TO THE LAB :  Get the blood work    Go to the front desk for the checkout Please make an appointment for your physical exam in 4 to 5 months   Watch your diet closely, reduce your carbohydrate intake.

## 2024-10-07 NOTE — Progress Notes (Unsigned)
" ° °  Subjective:    Patient ID: Jonathan Cohen, male    DOB: 1966/07/11, 59 y.o.   MRN: [id number]  DOS:  10/07/2024 Follow-up  Discussed the use of AI scribe software for clinical note transcription with the patient, who gave verbal consent to proceed.  History of Present Illness Jonathan Cohen is a 59 year old male who presents for a routine follow-up.  Constitutional symptoms - Feels well overall - No current symptoms  Upper respiratory symptoms - Recent mild congestion associated with weather change - Congestion resolved without treatment  Glycemic control - Mild diabetes - Hemoglobin A1c improved from 6.5 to 6.1 - Improvement attributed to dietary changes reducing breads, pasta, potatoes, and sweets - Not taking any medications for diabetes  Sexual function - Previously experienced erectile dysfunction while on medication - Erectile dysfunction resolved after discontinuing medication    Review of Systems See above   Past Medical History:  Diagnosis Date   Annual physical exam 09/13/2011   Back injury     Past Surgical History:  Procedure Laterality Date   COLONOSCOPY     LIPOMA EXCISION N/A 11/2016   from posterior  neck     No current outpatient medications     Objective:   Physical Exam BP 130/82   Pulse 76   Temp 97.7 F (36.5 C) (Oral)   Resp 16   Ht 6' 3 (1.905 m)   Wt 212 lb (96.2 kg)   SpO2 96%   BMI 26.50 kg/m  General:   Well developed, NAD, BMI noted. HEENT:  Normocephalic . Face symmetric, atraumatic DM foot exam: No edema, pinprick examination normal.  Skin: Not pale. Not jaundice Neurologic:  alert & oriented X3.  Speech normal, gait appropriate for age and unassisted Psych--  Cognition and judgment appear intact.  Cooperative with normal attention span and concentration.  Behavior appropriate. No anxious or depressed appearing.      Assessment    Problem list DM: A1c 6.5 2024 Sebaceous cyst, neck    PLAN DM: The  patient A1c was 6.5 at some point, explained to him this is mild diabetes.  We had a good conversation about diet, reduce portions, decrease carbohydrates.  Feet exam negative, check A1c and micro. Dyslipidemia: See LOV, took atorvastatin  temporarily but developed to ED.  Will recheck on RTC ED: Essentially resolved after statins were stopped. Had a flu shot RTC 4 to 5 months CPX   Dyslipidemia: Last LDL 118, cardiovascular risk 13%,rx atorvastatin  20 mg.  Taking it regularly.  Developed a new onset of erectile dysfunction.  Stopped statins for a couple of days and felt somewhat better. We agreed on the following: - checking a FLP AST ALT to see the results of his statins -  Although ED would be very unusual atorvastatin  side effect we agreed to stop it and see if the ED improves. Erectile dysfunction: See above, reassess in 6 months.    Hyperglycemia: Last A1c 6.1 RTC 6 months "

## 2024-10-08 ENCOUNTER — Ambulatory Visit: Payer: Self-pay | Admitting: Internal Medicine

## 2024-10-08 LAB — MICROALBUMIN / CREATININE URINE RATIO
Creatinine,U: 175.9 mg/dL
Microalb Creat Ratio: 5.2 mg/g (ref 0.0–30.0)
Microalb, Ur: 0.9 mg/dL (ref 0.7–1.9)

## 2024-10-08 LAB — HEMOGLOBIN A1C: Hgb A1c MFr Bld: 6.2 % (ref 4.6–6.5)

## 2024-10-08 NOTE — Assessment & Plan Note (Signed)
 DM: The patient's A1c was 6.5 at some point, explained to him this is mild diabetes.  We had a good conversation about diet, reduce food portions, decrease carbohydrates.  Feet exam negative, check A1c and micro. Dyslipidemia: See LOV, took atorvastatin  temporarily but developed  ED.  FLP recheck on RTC ED: Essentially resolved after statins were stopped. Had a flu shot RTC 4 to 5 months CPX

## 2025-01-01 ENCOUNTER — Encounter: Admitting: Internal Medicine
